# Patient Record
Sex: Female | Born: 1985
Health system: Southern US, Community
[De-identification: ages and names within clinical notes are randomized; demographics above are authoritative.]

## PROBLEM LIST (undated history)

## (undated) DIAGNOSIS — F419 Anxiety disorder, unspecified: Secondary | ICD-10-CM

## (undated) DIAGNOSIS — J45909 Unspecified asthma, uncomplicated: Secondary | ICD-10-CM

## (undated) DIAGNOSIS — F32A Depression, unspecified: Secondary | ICD-10-CM

## (undated) DIAGNOSIS — K219 Gastro-esophageal reflux disease without esophagitis: Secondary | ICD-10-CM

## (undated) DIAGNOSIS — E282 Polycystic ovarian syndrome: Secondary | ICD-10-CM

## (undated) DIAGNOSIS — R5383 Other fatigue: Secondary | ICD-10-CM

## (undated) DIAGNOSIS — K829 Disease of gallbladder, unspecified: Secondary | ICD-10-CM

## (undated) DIAGNOSIS — F5081 Binge eating disorder: Secondary | ICD-10-CM

## (undated) DIAGNOSIS — F988 Other specified behavioral and emotional disorders with onset usually occurring in childhood and adolescence: Secondary | ICD-10-CM

## (undated) DIAGNOSIS — R0602 Shortness of breath: Secondary | ICD-10-CM

## (undated) HISTORY — DX: Depression, unspecified: F32.A

## (undated) HISTORY — DX: Other fatigue: R53.83

## (undated) HISTORY — DX: Shortness of breath: R06.02

## (undated) HISTORY — PX: TONSILLECTOMY: SUR1361

## (undated) HISTORY — DX: Other specified behavioral and emotional disorders with onset usually occurring in childhood and adolescence: F98.8

## (undated) HISTORY — PX: ADENOIDECTOMY: SUR15

## (undated) HISTORY — DX: Anxiety disorder, unspecified: F41.9

## (undated) HISTORY — DX: Polycystic ovarian syndrome: E28.2

## (undated) HISTORY — DX: Disease of gallbladder, unspecified: K82.9

## (undated) HISTORY — DX: Binge eating disorder: F50.81

## (undated) HISTORY — DX: Gastro-esophageal reflux disease without esophagitis: K21.9

---

## 2019-06-07 DIAGNOSIS — F5105 Insomnia due to other mental disorder: Secondary | ICD-10-CM | POA: Insufficient documentation

## 2019-06-07 DIAGNOSIS — F411 Generalized anxiety disorder: Secondary | ICD-10-CM | POA: Insufficient documentation

## 2019-07-21 ENCOUNTER — Other Ambulatory Visit: Payer: Self-pay

## 2019-07-21 ENCOUNTER — Encounter (HOSPITAL_BASED_OUTPATIENT_CLINIC_OR_DEPARTMENT_OTHER): Payer: Self-pay | Admitting: Emergency Medicine

## 2019-07-21 ENCOUNTER — Emergency Department (HOSPITAL_BASED_OUTPATIENT_CLINIC_OR_DEPARTMENT_OTHER): Payer: BLUE CROSS/BLUE SHIELD

## 2019-07-21 ENCOUNTER — Emergency Department (HOSPITAL_BASED_OUTPATIENT_CLINIC_OR_DEPARTMENT_OTHER)
Admission: EM | Admit: 2019-07-21 | Discharge: 2019-07-21 | Disposition: A | Discharge status: Home / Self Care | Payer: BLUE CROSS/BLUE SHIELD | Attending: Emergency Medicine | Admitting: Emergency Medicine

## 2019-07-21 DIAGNOSIS — Z79899 Other long term (current) drug therapy: Secondary | ICD-10-CM | POA: Insufficient documentation

## 2019-07-21 DIAGNOSIS — F17210 Nicotine dependence, cigarettes, uncomplicated: Secondary | ICD-10-CM | POA: Insufficient documentation

## 2019-07-21 DIAGNOSIS — M25572 Pain in left ankle and joints of left foot: Secondary | ICD-10-CM | POA: Insufficient documentation

## 2019-07-21 HISTORY — DX: Unspecified asthma, uncomplicated: J45.909

## 2019-07-21 LAB — PREGNANCY, URINE: Preg Test, Ur: NEGATIVE

## 2019-07-21 MED ORDER — NAPROXEN 500 MG PO TABS: 500.0000 mg | ORAL_TABLET | Freq: Two times a day (BID) | ORAL | 0 refills | Status: AC

## 2019-07-21 MED ORDER — NAPROXEN 250 MG PO TABS
500.0000 mg | ORAL_TABLET | Freq: Once | ORAL | Status: AC
  Administered 2019-07-21: 500 mg via ORAL
  Filled 2019-07-21: qty 2

## 2019-07-21 NOTE — Discharge Instructions (Addendum)
I prescribed a short prescription of anti-inflammatories to help with your pain, please take 1 tablet twice a day for the next 7 days.  A referral to sports medicine provider has been given to you today, please schedule an appointment in order to further evaluate your likely ankle sprain.  You may also apply heat, ice to the area along with keep this elevated.

## 2019-07-21 NOTE — ED Provider Notes (Signed)
[facility] EMERGENCY DEPARTMENT Provider Note   CSN: [id number] Arrival date & time: 07/21/19  [facility]     History   Chief Complaint Chief Complaint  Patient presents with  . Ankle Injury    HPI Ashley Lucas is a 33 y.o. female.     33 y.o female with a PMH of Asthma presents to the ED with a chief complaint of left ankle pain x 2 days. Patient states she was at her bachelorette party this weekend wearing wedges when she twisted her ankle having her foot evert on the side. She ports there was pain at the time of the incident however she was drinking.  States the pain has gotten significantly worse, swelling has increased.  She has been taking Tylenol around-the-clock without improvement in symptoms.  States most of the pain is worse with plantarflexion.  Of note, patient also voices being delayed on her menstrual cycle, she is currently not on any birth control.  She denies any other trauma, nausea, vomiting, other complaints.  The history is provided by the patient.  Ankle Injury    Past Medical History:  Diagnosis Date  . Asthma     There are no active problems to display for this patient.   Past Surgical History:  Procedure Laterality Date  . TONSILLECTOMY       OB History   No obstetric history on file.      Home Medications    Prior to Admission medications   Medication Sig Start Date End Date Taking? Authorizing Provider  clonazePAM (KLONOPIN) 1 MG tablet Take 1 mg by mouth 2 (two) times daily as needed for anxiety.   Yes [provider]  Venlafaxine HCl 150 MG TB24 Take 150 mg by mouth daily.   Yes [provider]  naproxen (NAPROSYN) 500 MG tablet Take 1 tablet (500 mg total) by mouth 2 (two) times daily for 7 days. 07/21/19 07/28/19  Janeece Fitting, PA-C    Family History No family history on file.  Social History Social History   Tobacco Use  . Smoking status: Current Some Day Smoker  . Smokeless tobacco: Never Used   Substance Use Topics  . Alcohol use: Yes  . Drug use: Not on file     Allergies   Patient has no known allergies.   Review of Systems Review of Systems  Constitutional: Negative for fever.  Musculoskeletal: Positive for arthralgias.     Physical Exam Updated Vital Signs BP (!) 142/92 (BP Location: Left Arm)   Pulse 98   Temp 99.1 F (37.3 C) (Oral)   Resp 18   Ht 5\' 6"  (1.676 m)   Wt 113.4 kg   LMP 06/13/2019   SpO2 98%   BMI 40.35 kg/m   Physical Exam Vitals signs and nursing note reviewed.  Constitutional:      Appearance: Normal appearance. She is obese. She is not ill-appearing.  HENT:     Head: Normocephalic and atraumatic.     Mouth/Throat:     Mouth: Mucous membranes are dry.  Cardiovascular:     Rate and Rhythm: Normal rate.  Pulmonary:     Effort: Pulmonary effort is normal.  Abdominal:     General: Abdomen is flat.  Musculoskeletal:     Left ankle: She exhibits swelling. She exhibits normal range of motion, no laceration and normal pulse.       Feet:  Neurological:     Mental Status: She is alert and oriented to person, place,  and time.      ED Treatments / Results  Labs (all labs ordered are listed, but only abnormal results are displayed) Labs Reviewed  PREGNANCY, URINE    EKG None  Radiology Dg Ankle Complete Left  Result Date: 07/21/2019 CLINICAL DATA:  Left ankle injury. EXAM: LEFT ANKLE COMPLETE - 3+ VIEW COMPARISON:  None. FINDINGS: There is diffuse soft tissue swelling. No underlying fracture or subluxation identified. No radiopaque foreign bodies. IMPRESSION: 1. Soft tissue swelling. 2. No acute osseous abnormality noted. Electronically Signed   By: Signa Kell M.D.   On: 07/21/2019 17:37    Procedures Procedures (including critical care time)  Medications Ordered in ED Medications  naproxen (NAPROSYN) tablet 500 mg (has no administration in time range)     Initial Impression / Assessment and Plan / ED Course  I  have reviewed the triage vital signs and the nursing notes.  Pertinent labs & imaging results that were available during my care of the patient were reviewed by me and considered in my medical decision making (see chart for details).        Patient with no pertinent past medical history presents the ED with complaints of left ankle pain x 2 days after spraining her ankle at her bachelor party.  Reports swelling and pain has increased, most of the pain is mostly located on the lateral aspect along the lateral malleolus.  Significant swelling noted to the ankle.  Neurovascularly intact.  Will obtain an x-ray to further evaluate patient's condition.  Of note, patient reports she is 2 weeks late on her menstrual cycle, currently not on any birth control.  She would also like a pregnancy test checked during her ED visit.  X-ray of her left ankle showed: 1. Soft tissue swelling.  2. No acute osseous abnormality noted.   UA was negative.  These results have been discussed with patient at length, she will be placed on an ASO brace along with follow-up with orthopedist.  A referral for Dr. Clare Gandy have been provided, she also go home on a short course of anti-inflammatories to help with his pain.  Rice therapy is encouraged.    Portions of this note were generated with Scientist, clinical (histocompatibility and immunogenetics). Dictation errors may occur despite best attempts at proofreading.  Final Clinical Impressions(s) / ED Diagnoses   Final diagnoses:  Acute left ankle pain    ED Discharge Orders         Ordered    naproxen (NAPROSYN) 500 MG tablet  2 times daily     07/21/19 1744           Claude Manges, PA-C 07/21/19 1746    Pricilla Loveless, MD 07/21/19 2318

## 2019-07-21 NOTE — ED Triage Notes (Signed)
Pt twisted her L ankle Friday while drinking alcohol. Swelling noted.

## 2019-10-02 DIAGNOSIS — F411 Generalized anxiety disorder: Secondary | ICD-10-CM | POA: Diagnosis not present

## 2019-10-02 DIAGNOSIS — F330 Major depressive disorder, recurrent, mild: Secondary | ICD-10-CM | POA: Diagnosis not present

## 2019-11-19 ENCOUNTER — Ambulatory Visit (INDEPENDENT_AMBULATORY_CARE_PROVIDER_SITE_OTHER): Payer: Managed Care, Other (non HMO) | Admitting: Advanced Practice Midwife

## 2019-11-19 ENCOUNTER — Other Ambulatory Visit: Payer: Self-pay

## 2019-11-19 ENCOUNTER — Encounter: Payer: Self-pay | Admitting: Advanced Practice Midwife

## 2019-11-19 VITALS — BP 120/85 | HR 84 | Ht 66.0 in | Wt 263.1 lb

## 2019-11-19 DIAGNOSIS — Z124 Encounter for screening for malignant neoplasm of cervix: Secondary | ICD-10-CM

## 2019-11-19 DIAGNOSIS — Z01419 Encounter for gynecological examination (general) (routine) without abnormal findings: Secondary | ICD-10-CM

## 2019-11-19 DIAGNOSIS — Z1151 Encounter for screening for human papillomavirus (HPV): Secondary | ICD-10-CM

## 2019-11-19 DIAGNOSIS — E282 Polycystic ovarian syndrome: Secondary | ICD-10-CM

## 2019-11-19 DIAGNOSIS — N911 Secondary amenorrhea: Secondary | ICD-10-CM

## 2019-11-19 MED ORDER — MEDROXYPROGESTERONE ACETATE 10 MG PO TABS: 10.0000 mg | ORAL_TABLET | Freq: Every day | ORAL | 2 refills | Status: DC

## 2019-11-19 MED ORDER — METFORMIN HCL ER 500 MG PO TB24: 500.0000 mg | ORAL_TABLET | Freq: Every day | ORAL | 2 refills | Status: DC

## 2019-11-19 MED ORDER — PRENATAL VITAMIN 27-0.8 MG PO TABS: 1.0000 | ORAL_TABLET | Freq: Every day | ORAL | 12 refills | Status: AC

## 2019-11-19 NOTE — Progress Notes (Signed)
Patient ID: Ashley Lucas, female   DOB: [birth year], 34 y.o.   MRN: [id number] GYNECOLOGY ANNUAL PREVENTATIVE CARE ENCOUNTER NOTE  Subjective:   Ashley Lucas is a 34 y.o. G0P0000 female here for a routine annual gynecologic exam.  Current complaints: no period since October (trying to get pregnant).  Was diagnosed with PCOS years ago.  Used to be on Metformin in past.  No recent testing or treatment.   Denies abnormal vaginal bleeding, discharge, pelvic pain, problems with intercourse or other gynecologic concerns.    Gynecologic History Patient's last menstrual period was 08/27/2019 (exact date). Contraception: none Last Pap: 2018. Results were: normal   Obstetric History OB History  Gravida Para Term Preterm AB Living  0 0 0 0 0 0  SAB TAB Ectopic Multiple Live Births  0 0 0 0 0    Past Medical History:  Diagnosis Date  . Asthma   . PCOS (polycystic ovarian syndrome)     Past Surgical History:  Procedure Laterality Date  . TONSILLECTOMY      Current Outpatient Medications on File Prior to Visit  Medication Sig Dispense Refill  . clonazePAM (KLONOPIN) 1 MG tablet Take 1 mg by mouth 2 (two) times daily as needed for anxiety.    . Venlafaxine HCl 150 MG TB24 Take 150 mg by mouth daily.     No current facility-administered medications on file prior to visit.    No Known Allergies  Social History   Socioeconomic History  . Marital status: Single    Spouse name: Not on file  . Number of children: Not on file  . Years of education: Not on file  . Highest education level: Not on file  Occupational History  . Not on file  Tobacco Use  . Smoking status: Current Some Day Smoker  . Smokeless tobacco: Never Used  Substance and Sexual Activity  . Alcohol use: Yes    Alcohol/week: 3.0 standard drinks    Types: 2 Glasses of wine, 1 Cans of beer per week  . Drug use: Never  . Sexual activity: Yes    Birth control/protection: None  Other Topics Concern  .  Not on file  Social History Narrative  . Not on file   Social Determinants of Health   Financial Resource Strain:   . Difficulty of Paying Living Expenses: Not on file  Food Insecurity:   . Worried About Charity fundraiser in the Last Year: Not on file  . Ran Out of Food in the Last Year: Not on file  Transportation Needs:   . Lack of Transportation (Medical): Not on file  . Lack of Transportation (Non-Medical): Not on file  Physical Activity:   . Days of Exercise per Week: Not on file  . Minutes of Exercise per Session: Not on file  Stress:   . Feeling of Stress : Not on file  Social Connections:   . Frequency of Communication with Friends and Family: Not on file  . Frequency of Social Gatherings with Friends and Family: Not on file  . Attends Religious Services: Not on file  . Active Member of Clubs or Organizations: Not on file  . Attends Archivist Meetings: Not on file  . Marital Status: Not on file  Intimate Partner Violence:   . Fear of Current or Ex-Partner: Not on file  . Emotionally Abused: Not on file  . Physically Abused: Not on file  . Sexually Abused: Not on file    History reviewed.  No pertinent family history.  The following portions of the patient's history were reviewed and updated as appropriate: allergies, current medications, past family history, past medical history, past social history, past surgical history and problem list.  Review of Systems Pertinent items noted in HPI and remainder of comprehensive ROS otherwise negative.   Objective:  BP 120/85   Pulse 84   Ht 5\' 6"  (1.676 m)   Wt 263 lb 1.9 oz (119.4 kg)   LMP 08/27/2019 (Exact Date)   BMI 42.47 kg/m  CONSTITUTIONAL: Well-developed, well-nourished female in no acute distress.  EYES: Conjunctivae and EOM are normal. Pupils are equal, round, and reactive to light. No scleral icterus.  NECK: Normal range of motion, supple, no masses.  Normal thyroid.  SKIN: Skin is warm and dry.  No rash noted. Not diaphoretic. No erythema. No pallor. NEUROLOGIC: Alert and oriented to person, place, and time. Normal reflexes, muscle tone coordination. No cranial nerve deficit noted. PSYCHIATRIC: Normal mood and affect. Normal behavior. Normal judgment and thought content. CARDIOVASCULAR: Normal heart rate noted, regular rhythm RESPIRATORY: Effort and breath sounds normal, no problems with respiration noted. BREASTS: Symmetric in size. No masses, skin changes, nipple drainage, or lymphadenopathy. ABDOMEN: Soft, normal bowel sounds, no distention noted.  No tenderness, rebound or guarding.  PELVIC: Normal appearing external genitalia; normal appearing vaginal mucosa and cervix.  No abnormal discharge noted.  Pap smear obtained.  Normal uterine size, no other palpable masses, no uterine or adnexal tenderness. MUSCULOSKELETAL: Normal range of motion. No tenderness.  No cyanosis, clubbing, or edema.  2+ distal pulses.   Assessment:  Annual gynecologic examination with pap smear Polycystic Ovarian Syndrome   Plan:  Will follow up results of pap smear and manage accordingly. Consulted Dr 13/12/2018 re: evaluation and treatment of probable PCOS He agrees with testing for DHEA, test, TSH, HgbA1C Will order a Provera challenge to shed endometrium Will start Rx for Metformin XL 500mg  qd, discussed possible loose stools while adjusting Rx Prenatal vitamins for preconception RTO 4 weeks to review lab results and plan of care with MD Routine preventative health maintenance measures emphasized. Please refer to After Visit Summary for other counseling recommendations.

## 2019-11-19 NOTE — Progress Notes (Signed)
Pt has PCOS  LMP was 08/27/2019.

## 2019-11-19 NOTE — Patient Instructions (Signed)
Preparing for Pregnancy If you are considering becoming pregnant, make an appointment to see your regular health care provider to learn how to prepare for a safe and healthy pregnancy (preconception care). During a preconception care visit, your health care provider will:  Do a complete physical exam, including a Pap test.  Take a complete medical history.  Give you information, answer your questions, and help you resolve problems. Preconception checklist Medical history  Tell your health care provider about any current or past medical conditions. Your pregnancy or your ability to become pregnant may be affected by chronic conditions, such as diabetes, chronic hypertension, and thyroid problems.  Include your family's medical history as well as your partner's medical history.  Tell your health care provider about any history of STIs (sexually transmitted infections).These can affect your pregnancy. In some cases, they can be passed to your baby. Discuss any concerns that you have about STIs.  If indicated, discuss the benefits of genetic testing. This testing will show whether there are any genetic conditions that may be passed from you or your partner to your baby.  Tell your health care provider about: ? Any problems you have had with conception or pregnancy. ? Any medicines you take. These include vitamins, herbal supplements, and over-the-counter medicines. ? Your history of immunizations. Discuss any vaccinations that you may need. Diet  Ask your health care provider what to include in a healthy diet that has a balance of nutrients. This is especially important when you are pregnant or preparing to become pregnant.  Ask your health care provider to help you reach a healthy weight before pregnancy. ? If you are overweight, you may be at higher risk for certain complications, such as high blood pressure, diabetes, and preterm birth. ? If you are underweight, you are more likely to  have a baby who has a low birth weight. Lifestyle, work, and home  Let your health care provider know: ? About any lifestyle habits that you have, such as alcohol use, drug use, or smoking. ? About recreational activities that may put you at risk during pregnancy, such as downhill skiing and certain exercise programs. ? Tell your health care provider about any international travel, especially any travel to places with an active Zika virus outbreak. ? About harmful substances that you may be exposed to at work or at home. These include chemicals, pesticides, radiation, or even litter boxes. ? If you do not feel safe at home. Mental health  Tell your health care provider about: ? Any history of mental health conditions, including feelings of depression, sadness, or anxiety. ? Any medicines that you take for a mental health condition. These include herbs and supplements. Home instructions to prepare for pregnancy Lifestyle   Eat a balanced diet. This includes fresh fruits and vegetables, whole grains, lean meats, low-fat dairy products, healthy fats, and foods that are high in fiber. Ask to meet with a nutritionist or registered dietitian for assistance with meal planning and goals.  Get regular exercise. Try to be active for at least 30 minutes a day on most days of the week. Ask your health care provider which activities are safe during pregnancy.  Do not use any products that contain nicotine or tobacco, such as cigarettes and e-cigarettes. If you need help quitting, ask your health care provider.  Do not drink alcohol.  Do not take illegal drugs.  Maintain a healthy weight. Ask your health care provider what weight range is right for you. General   instructions  Keep an accurate record of your menstrual periods. This makes it easier for your health care provider to determine your baby's due date.  Begin taking prenatal vitamins and folic acid supplements daily as directed by your  health care provider.  Manage any chronic conditions, such as high blood pressure and diabetes, as told by your health care provider. This is important. How do I know that I am pregnant? You may be pregnant if you have been sexually active and you miss your period. Symptoms of early pregnancy include:  Mild cramping.  Very light vaginal bleeding (spotting).  Feeling unusually tired.  Nausea and vomiting (morning sickness). If you have any of these symptoms and you suspect that you might be pregnant, you can take a home pregnancy test. These tests check for a hormone in your urine (human chorionic gonadotropin, or hCG). A woman's body begins to make this hormone during early pregnancy. These tests are very accurate. Wait until at least the first day after you miss your period to take one. If the test shows that you are pregnant (you get a positive result), call your health care provider to make an appointment for prenatal care. What should I do if I become pregnant?      Make an appointment with your health care provider as soon as you suspect you are pregnant.  Do not use any products that contain nicotine, such as cigarettes, chewing tobacco, and e-cigarettes. If you need help quitting, ask your health care provider.  Do not drink alcoholic beverages. Alcohol is related to a number of birth defects.  Avoid toxic odors and chemicals.  You may continue to have sexual intercourse if it does not cause pain or other problems, such as vaginal bleeding. This information is not intended to replace advice given to you by your health care provider. Make sure you discuss any questions you have with your health care provider. Document Revised: 10/12/2017 Document Reviewed: 05/01/2016 Elsevier Patient Education  2020 [employer] Polycystic Ovarian Syndrome  Polycystic ovarian syndrome (PCOS) is a common hormonal disorder among women of reproductive age. In most women with PCOS, many small  fluid-filled sacs (cysts) grow on the ovaries, and the cysts are not part of a normal menstrual cycle. PCOS can cause problems with your menstrual periods and make it difficult to get pregnant. It can also cause an increased risk of miscarriage with pregnancy. If it is not treated, PCOS can lead to serious health problems, such as diabetes and heart disease. What are the causes? The cause of PCOS is not known, but it may be the result of a combination of certain factors, such as:  Irregular menstrual cycle.  High levels of certain hormones (androgens).  Problems with the hormone that helps to control blood sugar (insulin resistance).  Certain genes. What increases the risk? This condition is more likely to develop in women who have a family history of PCOS. What are the signs or symptoms? Symptoms of PCOS may include:  Multiple ovarian cysts.  Infrequent periods or no periods.  Periods that are too frequent or too heavy.  Unpredictable periods.  Inability to get pregnant (infertility) because of not ovulating.  Increased growth of hair on the face, chest, stomach, back, thumbs, thighs, or toes.  Acne or oily skin. Acne may develop during adulthood, and it may not respond to treatment.  Pelvic pain.  Weight gain or obesity.  Patches of thickened and dark brown or black skin on the neck, arms,  breasts, or thighs (acanthosis nigricans).  Excess hair growth on the face, chest, abdomen, or upper thighs (hirsutism). How is this diagnosed? This condition is diagnosed based on:  Your medical history.  A physical exam, including a pelvic exam. Your health care provider may look for areas of increased hair growth on your skin.  Tests, such as: ? Ultrasound. This may be used to examine the ovaries and the lining of the uterus (endometrium) for cysts. ? Blood tests. These may be used to check levels of sugar (glucose), female hormone (testosterone), and female hormones (estrogen and  progesterone) in your blood. How is this treated? There is no cure for PCOS, but treatment can help to manage symptoms and prevent more health problems from developing. Treatment varies depending on:  Your symptoms.  Whether you want to have a baby or whether you need birth control (contraception). Treatment may include nutrition and lifestyle changes along with:  Progesterone hormone to start a menstrual period.  Birth control pills to help you have regular menstrual periods.  Medicines to make you ovulate, if you want to get pregnant.  Medicine to reduce excessive hair growth.  Surgery, in severe cases. This may involve making small holes in one or both of your ovaries. This decreases the amount of testosterone that your body produces. Follow these instructions at home:  Take over-the-counter and prescription medicines only as told by your health care provider.  Follow a healthy meal plan. This can help you reduce the effects of PCOS. ? Eat a healthy diet that includes lean proteins, complex carbohydrates, fresh fruits and vegetables, low-fat dairy products, and healthy fats. Make sure to eat enough fiber.  If you are overweight, lose weight as told by your health care provider. ? Losing 10% of your body weight may improve symptoms. ? Your health care provider can determine how much weight loss is best for you and can help you lose weight safely.  Keep all follow-up visits as told by your health care provider. This is important. Contact a health care provider if:  Your symptoms do not get better with medicine.  You develop new symptoms. This information is not intended to replace advice given to you by your health care provider. Make sure you discuss any questions you have with your health care provider. Document Revised: 09/22/2017 Document Reviewed: 03/27/2016 Elsevier Patient Education  2020 [employer] Inc. Diet for Polycystic Ovary Syndrome Polycystic ovary syndrome (PCOS)  is a disorder of the chemicals (hormones) that regulate a woman's reproductive system, including monthly periods (menstruation). The condition causes important hormones to be out of balance. PCOS can:  Stop your periods or make them irregular.  Cause cysts to develop on your ovaries.  Make it difficult to get pregnant.  Stop your body from responding to the effects of insulin (insulin resistance). Insulin resistance can lead to obesity and diabetes. Changing what you eat can help you manage PCOS and improve your health. Following a balanced diet can help you lose weight and improve the way that your body uses insulin. What are tips for following this plan?  Follow a balanced diet for meals and snacks. Eat breakfast, lunch, dinner, and one or two snacks every day.  Include protein in each meal and snack.  Choose whole grains instead of products that are made with refined flour.  Eat a variety of foods.  Exercise regularly as told by your health care provider. Aim to do 30 or more minutes of exercise on most days of  the week.  If you are overweight or obese: ? Pay attention to how many calories you eat. Cutting down on calories can help you lose weight. ? Work with your health care provider or a diet and nutrition specialist (dietitian) to figure out how many calories you need each day. What foods can I eat?  Fruits Include a variety of colors and types. All fruits are helpful for PCOS. Vegetables Include a variety of colors and types. All vegetables are helpful for PCOS. Grains Whole grains, such as whole wheat. Whole-grain breads, crackers, cereals, and pasta. Unsweetened oatmeal, bulgur, barley, quinoa, and brown rice. Tortillas made from corn or whole-wheat flour. Meats and other proteins Low-fat (lean) proteins, such as fish, chicken, beans, eggs, and tofu. Dairy Low-fat dairy products, such as skim milk, cheese sticks, and yogurt. Beverages Low-fat or fat-free drinks, such  as water, low-fat milk, sugar-free drinks, and small amounts of 100% fruit juice. Seasonings and condiments Ketchup. Mustard. Barbecue sauce. Relish. Low-fat or fat-free mayonnaise. Fats and oils Olive oil or canola oil. Walnuts and almonds. The items listed above may not be a complete list of recommended foods and beverages. Contact a dietitian for more options. What foods are not recommended? Foods that are high in calories or fat. Fried foods. Sweets. Products that are made from refined white flour, including white bread, pastries, white rice, and pasta. The items listed above may not be a complete list of foods and beverages to avoid. Contact a dietitian for more information. Summary  PCOS is a hormonal imbalance that affects a woman's reproductive system.  You can help to manage your PCOS by exercising regularly and eating a healthy, varied diet of vegetables, fruit, whole grains, low-fat (lean) protein, and low-fat dairy products.  Changing what you eat can improve the way that your body uses insulin, help your hormones reach normal levels, and help you lose weight. This information is not intended to replace advice given to you by your health care provider. Make sure you discuss any questions you have with your health care provider. Document Revised: 01/30/2019 Document Reviewed: 08/14/2017 Elsevier Patient Education  [place].

## 2019-11-20 LAB — HCG, SERUM, QUALITATIVE: hCG,Beta Subunit,Qual,Serum: NEGATIVE m[IU]/mL (ref <6)

## 2019-11-20 LAB — TESTOSTERONE: Testosterone: 37 ng/dL (ref 8–48)

## 2019-11-20 LAB — DHEA-SULFATE: DHEA-SO4: 58.7 ug/dL — ABNORMAL LOW (ref 84.8–378.0)

## 2019-11-20 LAB — HEMOGLOBIN A1C
Est. average glucose Bld gHb Est-mCnc: 91 mg/dL
Hgb A1c MFr Bld: 4.8 % (ref 4.8–5.6)

## 2019-11-20 LAB — TSH: TSH: 1.01 u[IU]/mL (ref 0.450–4.500)

## 2019-11-25 LAB — CYTOLOGY - PAP
Comment: NEGATIVE
High risk HPV: POSITIVE — AB

## 2019-11-29 ENCOUNTER — Encounter: Payer: Self-pay | Admitting: Family Medicine

## 2019-11-29 ENCOUNTER — Other Ambulatory Visit: Payer: Self-pay

## 2019-11-29 ENCOUNTER — Ambulatory Visit (INDEPENDENT_AMBULATORY_CARE_PROVIDER_SITE_OTHER): Payer: Managed Care, Other (non HMO) | Admitting: Family Medicine

## 2019-11-29 VITALS — BP 129/88 | HR 105 | Ht 66.0 in | Wt 262.0 lb

## 2019-11-29 DIAGNOSIS — E282 Polycystic ovarian syndrome: Secondary | ICD-10-CM | POA: Diagnosis not present

## 2019-11-29 DIAGNOSIS — R87612 Low grade squamous intraepithelial lesion on cytologic smear of cervix (LGSIL): Secondary | ICD-10-CM | POA: Diagnosis not present

## 2019-11-29 MED ORDER — METFORMIN HCL ER 500 MG PO TB24: 1500.0000 mg | ORAL_TABLET | Freq: Every day | ORAL | 6 refills | Status: DC

## 2019-11-29 NOTE — Progress Notes (Signed)
Patient presents for follow up of PCOS discussion and to review pap smear results. Armandina Stammer RN

## 2019-11-29 NOTE — Progress Notes (Signed)
   Subjective:    Patient ID: Ashley Lucas, female    DOB: 1986-09-06, 34 y.o.   MRN: [id number]  HPI Patient seen for PCOS. Was diagnosed as teen with elevated serum testosterone and irregular periods, polycystic ovaries. Has used COC in the past for cycle control. Was also on metformin. Would like to get pregnant. Was seen last week for annual. Last period was in November. Was placed on provera x 10 days and metformin xr '500mg'$ . Last dose of provera yesterday. No bleeding yet.   Review of Systems     Objective:   Physical Exam Constitutional:      Appearance: Normal appearance.  Cardiovascular:     Rate and Rhythm: Normal rate.     Pulses: Normal pulses.  Pulmonary:     Effort: Pulmonary effort is normal.  Skin:    Capillary Refill: Capillary refill takes less than 2 seconds.  Neurological:     General: No focal deficit present.     Mental Status: She is alert.  Psychiatric:        Mood and Affect: Mood normal.        Behavior: Behavior normal.        Thought Content: Thought content normal.        Judgment: Judgment normal.       Assessment & Plan:  1. PCOS (polycystic ovarian syndrome) May have bleeding over next several days. Increase metformin to '1500mg'$  daily for maximum fertility effects. Discussed possible side effects to the medication: nausea, upset stomach, diarrhea. Recommended taking with largest meal. Also recommended ovulation kit. F/u in 3 months for PCOS/fertility.  2. LGSIL on Pap smear of cervix Discussed PAP and LGSIL. Recommend colposcopy. Will schedule next available.

## 2019-12-19 ENCOUNTER — Other Ambulatory Visit: Payer: Self-pay

## 2019-12-19 ENCOUNTER — Encounter (INDEPENDENT_AMBULATORY_CARE_PROVIDER_SITE_OTHER): Payer: Managed Care, Other (non HMO) | Admitting: Family Medicine

## 2019-12-20 NOTE — Progress Notes (Signed)
Patient moved appointment

## 2019-12-23 ENCOUNTER — Ambulatory Visit (INDEPENDENT_AMBULATORY_CARE_PROVIDER_SITE_OTHER): Payer: Managed Care, Other (non HMO) | Admitting: Family Medicine

## 2019-12-23 ENCOUNTER — Other Ambulatory Visit: Payer: Self-pay

## 2019-12-23 ENCOUNTER — Encounter: Payer: Self-pay | Admitting: Family Medicine

## 2019-12-23 ENCOUNTER — Other Ambulatory Visit (HOSPITAL_COMMUNITY)
Admission: RE | Admit: 2019-12-23 | Discharge: 2019-12-23 | Disposition: A | Discharge status: Home / Self Care | Payer: Managed Care, Other (non HMO) | Source: Ambulatory Visit | Attending: Family Medicine | Admitting: Family Medicine

## 2019-12-23 VITALS — BP 111/85 | HR 105 | Wt 263.1 lb

## 2019-12-23 DIAGNOSIS — R87612 Low grade squamous intraepithelial lesion on cytologic smear of cervix (LGSIL): Secondary | ICD-10-CM | POA: Diagnosis not present

## 2019-12-23 DIAGNOSIS — Z3202 Encounter for pregnancy test, result negative: Secondary | ICD-10-CM

## 2019-12-23 LAB — POCT URINE PREGNANCY: Preg Test, Ur: NEGATIVE

## 2019-12-23 NOTE — Patient Instructions (Signed)
Colposcopy, Care After This sheet gives you information about how to care for yourself after your procedure. Your doctor may also give you more specific instructions. If you have problems or questions, contact your doctor. What can I expect after the procedure? If you did not have a tissue sample removed (did not have a biopsy), you may only have some spotting for a few days. You can go back to your normal activities. If you had a tissue sample removed, it is common to have:  Soreness and pain. This may last for a few days.  Light-headedness.  Mild bleeding from your vagina or dark-colored, grainy discharge from your vagina. This may last for a few days. You may need to wear a sanitary pad.  Spotting for at least 48 hours after the procedure. Follow these instructions at home:   Take over-the-counter and prescription medicines only as told by your doctor. Ask your doctor what medicines you can start taking again. This is very important if you take blood-thinning medicine.  Do not drive or use heavy machinery while taking prescription pain medicine.  For 3 days, or as long as your doctor tells you, avoid: ? Douching. ? Using tampons. ? Having sex.  If you use birth control (contraception), keep using it.  Limit activity for the first day after the procedure. Ask your doctor what activities are safe for you.  It is up to you to get the results of your procedure. Ask your doctor when your results will be ready.  Keep all follow-up visits as told by your doctor. This is important. Contact a doctor if:  You get a skin rash. Get help right away if:  You are bleeding a lot from your vagina. It is a lot of bleeding if you are using more than one pad an hour for 2 hours in a row.  You have clumps of blood (blood clots) coming from your vagina.  You have a fever.  You have chills  You have pain in your lower belly (pelvic area).  You have signs of infection, such as vaginal  discharge that is: ? Different than usual. ? Yellow. ? Bad-smelling.  You have very pain or cramps in your lower belly that do not get better with medicine.  You feel light-headed.  You feel dizzy.  You pass out (faint). Summary  If you did not have a tissue sample removed (did not have a biopsy), you may only have some spotting for a few days. You can go back to your normal activities.  If you had a tissue sample removed, it is common to have mild pain and spotting for 48 hours.  For 3 days, or as long as your doctor tells you, avoid douching, using tampons and having sex.  Get help right away if you have bleeding, very bad pain, or signs of infection. This information is not intended to replace advice given to you by your health care provider. Make sure you discuss any questions you have with your health care provider. Document Revised: 09/22/2017 Document Reviewed: 06/29/2016 Elsevier Patient Education  2020 Elsevier Inc.  

## 2019-12-23 NOTE — Progress Notes (Signed)
Patient Name: Ashley Lucas, female   DOB: 1986/08/15, 34 y.o.  MRN: [id number]  Colposcopy Procedure Note:  [id number] Pregnancy status: Unknown Indications: LSIL HPV:  High Risk Cervical History:  Previous Abnormal Pap: normal  Previous Colposcopy: none  Previous LEEP or Cryo: none  Smoking: Current Hysterectomy: No   Patient given informed consent, signed copy in the chart, time out was performed.    Exam: Vulva and Vagina grossly normal.  Cervix viewed with speculum and colposcope after application of acetic acid:  Cervix Fully Visualized Squamocolumnar Junction Visibility: Fully visualized  Acetowhite lesions: none  Other Lesions: None Punctation: Not present  Mosaicism: Not present Abnormal vasculature: No   Biopsies: none ECC: brush  Hemostasis achieved with:  n/a  Colposcopy Impression:  CIN1   Patient was given post procedure instructions.  Will call patient with results.

## 2019-12-24 LAB — SURGICAL PATHOLOGY

## 2020-05-07 ENCOUNTER — Other Ambulatory Visit: Payer: Self-pay

## 2020-05-07 ENCOUNTER — Encounter: Payer: Self-pay | Admitting: Family Medicine

## 2020-05-07 ENCOUNTER — Ambulatory Visit (INDEPENDENT_AMBULATORY_CARE_PROVIDER_SITE_OTHER): Payer: Self-pay | Admitting: Family Medicine

## 2020-05-07 VITALS — BP 115/85 | HR 96 | Ht 66.0 in

## 2020-05-07 DIAGNOSIS — E282 Polycystic ovarian syndrome: Secondary | ICD-10-CM

## 2020-05-07 DIAGNOSIS — N979 Female infertility, unspecified: Secondary | ICD-10-CM

## 2020-05-07 MED ORDER — LETROZOLE 2.5 MG PO TABS: 5.0000 mg | ORAL_TABLET | Freq: Every day | ORAL | 2 refills | Status: AC

## 2020-05-07 MED ORDER — METFORMIN HCL ER 500 MG PO TB24: 1000.0000 mg | ORAL_TABLET | Freq: Every day | ORAL | 6 refills | Status: DC

## 2020-05-07 MED ORDER — MEDROXYPROGESTERONE ACETATE 10 MG PO TABS: 10.0000 mg | ORAL_TABLET | Freq: Every day | ORAL | 2 refills | Status: AC

## 2020-05-07 NOTE — Progress Notes (Signed)
   Subjective:    Patient ID: Ashley Lucas, female    DOB: 09-Sep-1986, 34 y.o.   MRN: [id number]  HPI Patient seen for follow up of PCOS. She has been taking metformin XR '1000mg'$  daily as she was unable to tolerate the '1500mg'$  dose. Her periods have increased to every 60 days or so. Has been trying to get pregnant. Is not using an ovulation kit, but charts her cycle.   Review of Systems     Objective:   Physical Exam Vitals reviewed.  Constitutional:      Appearance: Normal appearance.  Skin:    Capillary Refill: Capillary refill takes less than 2 seconds.  Neurological:     Mental Status: She is alert.  Psychiatric:        Mood and Affect: Mood normal.        Behavior: Behavior normal.        Thought Content: Thought content normal.        Judgment: Judgment normal.       Assessment & Plan:  1. PCOS (polycystic ovarian syndrome) Discussed weight loss to help with fertility. Will refer to [facility]. - Ambulatory referral to [facility]  2. Infertility, female Discussed Letrozole which has been shown to be superior to Clomid in ovulation induction, in PCOS patients with BMI >30.  Patient agreed to try this.  Letrozole prescribed, 2.5 mg po daily on days 3 to 7 following a spontaneous menses or progestin-induced bleed. If the cycle is ovulatory but pregnancy has not occurred, the same dose should be used in the next cycle. If ovulation does not occur, the dose should be increased to 5 mg/day cycle days 3 to 7, with a maximal dose of 7.5 mg/day.   Provera 10 mg po qd x 10 days also prescribed for progestin-induced bleed. Continue ovulation predictor kits and regular intercourse

## 2020-05-07 NOTE — Patient Instructions (Addendum)
  Letrozole prescribed, 2.5 mg po daily on days 3 to 7 following a spontaneous menses or progestin-induced bleed. If the cycle is ovulatory but pregnancy has not occurred, the same dose should be used in the next cycle. If ovulation does not occur, the dose should be increased to 5 mg/day cycle days 3 to 7, with a maximal dose of 7.5 mg/day.   Provera 10 mg po qd x 10 days also prescribed for progestin-induced bleed. Continue ovulation predictor kits and regular intercourse

## 2020-05-07 NOTE — Progress Notes (Signed)
Patient has been off birth control for one year and having trouble getting pregnant. Patient has had four periods since January 2021(LMPs: 05/01/20, 03/15/2020) Patient reports average length between periods is 61 days. Armandina Stammer RN

## 2020-07-30 DIAGNOSIS — R632 Polyphagia: Secondary | ICD-10-CM | POA: Diagnosis not present

## 2020-07-30 DIAGNOSIS — R41840 Attention and concentration deficit: Secondary | ICD-10-CM | POA: Diagnosis not present

## 2020-07-30 DIAGNOSIS — F411 Generalized anxiety disorder: Secondary | ICD-10-CM | POA: Diagnosis not present

## 2020-07-30 DIAGNOSIS — F3342 Major depressive disorder, recurrent, in full remission: Secondary | ICD-10-CM | POA: Diagnosis not present

## 2020-08-19 ENCOUNTER — Ambulatory Visit (INDEPENDENT_AMBULATORY_CARE_PROVIDER_SITE_OTHER): Payer: BC Managed Care – PPO | Admitting: Family Medicine

## 2020-08-19 ENCOUNTER — Other Ambulatory Visit: Payer: Self-pay

## 2020-08-19 ENCOUNTER — Encounter (INDEPENDENT_AMBULATORY_CARE_PROVIDER_SITE_OTHER): Payer: Self-pay | Admitting: Family Medicine

## 2020-08-19 VITALS — BP 115/80 | HR 93 | Temp 97.5°F | Ht 66.0 in | Wt 262.0 lb

## 2020-08-19 DIAGNOSIS — E282 Polycystic ovarian syndrome: Secondary | ICD-10-CM

## 2020-08-19 DIAGNOSIS — Z6841 Body Mass Index (BMI) 40.0 and over, adult: Secondary | ICD-10-CM

## 2020-08-19 DIAGNOSIS — Z9189 Other specified personal risk factors, not elsewhere classified: Secondary | ICD-10-CM

## 2020-08-19 DIAGNOSIS — R0602 Shortness of breath: Secondary | ICD-10-CM

## 2020-08-19 DIAGNOSIS — R0681 Apnea, not elsewhere classified: Secondary | ICD-10-CM

## 2020-08-19 DIAGNOSIS — Z1331 Encounter for screening for depression: Secondary | ICD-10-CM | POA: Diagnosis not present

## 2020-08-19 DIAGNOSIS — Z0289 Encounter for other administrative examinations: Secondary | ICD-10-CM

## 2020-08-19 DIAGNOSIS — F5081 Binge eating disorder: Secondary | ICD-10-CM

## 2020-08-19 DIAGNOSIS — R5383 Other fatigue: Secondary | ICD-10-CM

## 2020-08-19 DIAGNOSIS — F419 Anxiety disorder, unspecified: Secondary | ICD-10-CM

## 2020-08-20 LAB — COMPREHENSIVE METABOLIC PANEL
ALT: 14 IU/L (ref 0–32)
AST: 16 IU/L (ref 0–40)
Albumin/Globulin Ratio: 2 (ref 1.2–2.2)
Albumin: 4.7 g/dL (ref 3.8–4.8)
Alkaline Phosphatase: 72 IU/L (ref 44–121)
BUN/Creatinine Ratio: 17 (ref 9–23)
BUN: 12 mg/dL (ref 6–20)
Bilirubin Total: 0.5 mg/dL (ref 0.0–1.2)
CO2: 28 mmol/L (ref 20–29)
Calcium: 9.8 mg/dL (ref 8.7–10.2)
Chloride: 102 mmol/L (ref 96–106)
Creatinine, Ser: 0.7 mg/dL (ref 0.57–1.00)
GFR calc Af Amer: 133 mL/min/{1.73_m2} (ref >59)
GFR calc non Af Amer: 115 mL/min/{1.73_m2} (ref >59)
Globulin, Total: 2.3 g/dL (ref 1.5–4.5)
Glucose: 85 mg/dL (ref 65–99)
Potassium: 4.6 mmol/L (ref 3.5–5.2)
Sodium: 141 mmol/L (ref 134–144)
Total Protein: 7 g/dL (ref 6.0–8.5)

## 2020-08-20 LAB — LIPID PANEL WITH LDL/HDL RATIO
Cholesterol, Total: 220 mg/dL — ABNORMAL HIGH (ref 100–199)
HDL: 60 mg/dL (ref >39)
LDL Chol Calc (NIH): 142 mg/dL — ABNORMAL HIGH (ref 0–99)
LDL/HDL Ratio: 2.4 ratio (ref 0.0–3.2)
Triglycerides: 99 mg/dL (ref 0–149)
VLDL Cholesterol Cal: 18 mg/dL (ref 5–40)

## 2020-08-20 LAB — CBC WITH DIFFERENTIAL/PLATELET
Basophils Absolute: 0.1 10*3/uL (ref 0.0–0.2)
Basos: 1 %
EOS (ABSOLUTE): 0.2 10*3/uL (ref 0.0–0.4)
Eos: 2 %
Hemoglobin: 14.8 g/dL (ref 11.1–15.9)
Immature Grans (Abs): 0 10*3/uL (ref 0.0–0.1)
Immature Granulocytes: 1 %
Lymphocytes Absolute: 3 10*3/uL (ref 0.7–3.1)
Lymphs: 35 %
MCH: 31.6 pg (ref 26.6–33.0)
MCHC: 34.1 g/dL (ref 31.5–35.7)
MCV: 93 fL (ref 79–97)
Monocytes Absolute: 0.4 10*3/uL (ref 0.1–0.9)
Monocytes: 5 %
Neutrophils Absolute: 4.8 10*3/uL (ref 1.4–7.0)
Neutrophils: 56 %
Platelets: 292 10*3/uL (ref 150–450)
RBC: 4.68 x10E6/uL (ref 3.77–5.28)
RDW: 12.2 % (ref 11.7–15.4)
WBC: 8.5 10*3/uL (ref 3.4–10.8)

## 2020-08-20 LAB — ANEMIA PANEL
Ferritin: 166 ng/mL — ABNORMAL HIGH (ref 15–150)
Folate, Hemolysate: 447 ng/mL
Folate, RBC: 1030 ng/mL (ref >498)
Hematocrit: 43.4 % (ref 34.0–46.6)
Iron Saturation: 20 % (ref 15–55)
Iron: 61 ug/dL (ref 27–159)
Retic Ct Pct: 2.4 % (ref 0.6–2.6)
Total Iron Binding Capacity: 305 ug/dL (ref 250–450)
UIBC: 244 ug/dL (ref 131–425)
Vitamin B-12: 683 pg/mL (ref 232–1245)

## 2020-08-20 LAB — VITAMIN D 25 HYDROXY (VIT D DEFICIENCY, FRACTURES): Vit D, 25-Hydroxy: 28.3 ng/mL — ABNORMAL LOW (ref 30.0–100.0)

## 2020-08-20 LAB — HEMOGLOBIN A1C
Est. average glucose Bld gHb Est-mCnc: 94 mg/dL
Hgb A1c MFr Bld: 4.9 % (ref 4.8–5.6)

## 2020-08-20 LAB — TSH: TSH: 1.81 u[IU]/mL (ref 0.450–4.500)

## 2020-08-20 LAB — T4: T4, Total: 8.5 ug/dL (ref 4.5–12.0)

## 2020-08-20 LAB — INSULIN, RANDOM: INSULIN: 9.2 u[IU]/mL (ref 2.6–24.9)

## 2020-08-20 NOTE — Progress Notes (Signed)
Dear Dr. Adrian Blackwater,   Thank you for referring Ashley Lucas to our clinic. The following note includes my evaluation and treatment recommendations.  Chief Complaint:   OBESITY Ashley Lucas (MR# [id number]) is a 34 y.o. female who presents for evaluation and treatment of obesity and related comorbidities. Current BMI is Body mass index is 42.29 kg/m. Ashley Lucas has been struggling with her weight for many years and has been unsuccessful in either losing weight, maintaining weight loss, or reaching her healthy weight goal.  Ashley Lucas is currently in the action stage of change and ready to dedicate time achieving and maintaining a healthier weight. Ashley Lucas is interested in becoming our patient and working on intensive lifestyle modifications including (but not limited to) diet and exercise for weight loss.  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  She lives with her husband, and has 3 stepsons.  She gets about 2,469 steps per day.  She has a history of bulimia - no purging in 6-7 years - had CBT.  ETOH, wine/beer, 2-3 drinks, 3-4 nights per week.  Her goal weight is 185 pounds. She is actively working on becoming pregnant.   [name]'s habits were reviewed today and are as follows: Her family eats meals together, she thinks her family will eat healthier with her, her desired weight loss is 78 pounds, she has been heavy most of her life, she started gaining weight as a child, her heaviest weight ever was 263 pounds, she craves salty snacks, cookies, and ice cream, she snacks frequently in the evenings, she skips breakfast frequently, she is frequently drinking liquids with calories, she frequently makes poor food choices, she frequently eats larger portions than normal, she has binge eating behaviors and she struggles with emotional eating.  Depression Screen Ashley Lucas's Food and Mood (modified PHQ-9) score was 20.  Depression screen PHQ 2/9  08/19/2020  Decreased Interest 3  Down, Depressed, Hopeless 3  PHQ - 2 Score 6  Altered sleeping 3  Tired, decreased energy 3  Change in appetite 3  Feeling bad or failure about yourself  2  Trouble concentrating 2  Moving slowly or fidgety/restless 0  Suicidal thoughts 0  PHQ-9 Score 19   Assessment/Plan:   1. Other fatigue [name] denies daytime somnolence and reports waking up still tired. Patent has a history of symptoms of morning fatigue and snoring. Ashley Lucas generally gets 6 or 7 hours of sleep per night, and states that she has poor quality sleep. Snoring is present. Apneic episodes are present. Epworth Sleepiness Score is 1.  [name] does feel that her weight is causing her energy to be lower than it should be. Fatigue may be related to obesity, depression or many other causes. Labs will be ordered, and in the meanwhile, Ashley Lucas will focus on self care including making healthy food choices, increasing physical activity and focusing on stress reduction.  - Vitamin B12 - T4 - TSH - VITAMIN D 25 Hydroxy (Vit-D Deficiency, Fractures) - Anemia panel  2. Shortness of breath on exertion Ashley Lucas notes increasing shortness of breath with exercising and seems to be worsening over time with weight gain. She notes getting out of breath sooner with activity than she used to. This has gotten worse recently. [name] denies shortness of breath at rest or orthopnea.  Ashley Lucas does feel that she gets out of breath more easily that she used to when she exercises. Ashley Lucas's shortness of breath appears to be obesity related and exercise induced. She has  agreed to work on weight loss and gradually increase exercise to treat her exercise induced shortness of breath. Will continue to monitor closely.  - EKG 12-Lead - CBC with Differential/Platelet - Comprehensive metabolic panel - Hemoglobin A1c - Insulin, random - Lipid Panel With LDL/HDL Ratio  3. PCOS (polycystic ovarian  syndrome) Controlled. She will continue to focus on protein-rich, low simple carbohydrate foods. We reviewed the importance of hydration, regular exercise for stress reduction, and restorative sleep. Medication: Metformin 1,000 mg daily.  Counseling . PCOS is a leading cause of menstrual irregularities and infertility. It is also associated with obesity, hirsutism (excessive hair growth on the face, chest, or back), and cardiovascular risk factors such as high cholesterol and insulin resistance. . Insulin resistance appears to play a central role.  . Women with PCOS have been shown to have impaired appetite-regulating hormones. . Metformin is one medication that can improve metabolic parameters.  . Women with polycystic ovary syndrome (PCOS) have an increased risk for cardiovascular disease (CVD) - European Journal of Preventive Cardiology.  4. Apnea with snoring and poor sleep Ashley Lucas has symptoms consistent with OSA. See scanned New Patient paperwork.  Plan:  Consider sleep study.  5. Binge eating disorder, with emotional eating Ashley Lucas meets binge eating disorder (BED) requirements.  PHQ-9 is 20.  She started taking Vyvanse 3 months ago.  She says that her triggers are stress and being tired in the early afternoon/late evening (6-9 pm/12-3 on weekends).  Foods:  Sweet/salty carbs.  Fast food.  She eats quickly and is still binging 2-3 days per week.  She has tried Effexor, Zoloft, Buspar, propanolol, and benzodiazepines in the past.   People who binge eat feel as if they don't have control over how much they eat and have feelings of guilt or self-loathing after a binge eating episode. Ashley Lucas estimates that about 30 percent of adults with binge eating disorder also have a history of ADHD. Binge eating and related obesity can underlie health problems like heart disease and diabetes. The FDA has approved Vyvanse as a treatment option for both ADHD and binge eating. Vyvanse targets the  brain's reward center by increasing the levels of dopamine and norepinephrine, the chemicals of the brain responsible for feelings of pleasure. Mindful eating is the recommended nutritional approach to treating BED.   6. Depression screening Chayil was screened for depression as part of her new patient workup today.  PHQ-9 is 20.  Ashley Lucas had a positive depression screening. Depression is commonly associated with obesity and often results in emotional eating behaviors. We will monitor this closely and work on CBT to help improve the non-hunger eating patterns. Referral to Psychology may be required if no improvement is seen as she continues in our clinic.  7. At risk for anxiety Ashley Lucas was given approximately 15 minutes of anxiety risk counseling today. She has risk factors for anxiety and recently weaned off of her antianxiety medication. We discussed the importance of a healthy work life balance, a healthy relationship with food and a good support system.  8. Class 3 severe obesity with serious comorbidity and body mass index (BMI) of 40.0 to 44.9 in adult, unspecified obesity type (HCC)  Ashley Lucas is currently in the action stage of change and her goal is to continue with weight loss efforts. I recommend Ashley Lucas begin the structured treatment plan as follows:  She has agreed to practicing portion control and making smarter food choices, such as increasing vegetables and decreasing simple carbohydrates.  Exercise  goals: No exercise has been prescribed at this time.   Behavioral modification strategies: increasing lean protein intake, decreasing simple carbohydrates, increasing vegetables, increasing water intake, decreasing liquid calories, decreasing alcohol intake, decreasing sodium intake, increasing high fiber foods and emotional eating strategies.  She was informed of the importance of frequent follow-up visits to maximize her success with intensive lifestyle modifications for  her multiple health conditions. She was informed we would discuss her lab results at her next visit unless there is a critical issue that needs to be addressed sooner. Ashley CornfieldStephanie agreed to keep her next visit at the agreed upon time to discuss these results.  Objective:   Blood pressure 115/80, pulse 93, temperature (!) 97.5 F (36.4 C), height 5\' 6"  (1.676 m), weight 262 lb (118.8 kg), SpO2 99 %. Body mass index is 42.29 kg/m.  EKG: Normal sinus rhythm, rate 92 bpm.  Indirect Calorimeter completed today shows a VO2 of 325 and a REE of 2260.  Her calculated basal metabolic rate is [id number] thus her basal metabolic rate is better than expected.  General: Cooperative, alert, well developed, in no acute distress. HEENT: Conjunctivae and lids unremarkable. Cardiovascular: Regular rhythm.  Lungs: Normal work of breathing. Neurologic: No focal deficits.   Lab Results  Component Value Date   CREATININE 0.70 08/19/2020   BUN 12 08/19/2020   NA 141 08/19/2020   K 4.6 08/19/2020   CL 102 08/19/2020   CO2 28 08/19/2020   Lab Results  Component Value Date   ALT 14 08/19/2020   AST 16 08/19/2020   ALKPHOS 72 08/19/2020   BILITOT 0.5 08/19/2020   Lab Results  Component Value Date   HGBA1C 4.9 08/19/2020   HGBA1C 4.8 11/19/2019   Lab Results  Component Value Date   INSULIN 9.2 08/19/2020   Lab Results  Component Value Date   TSH 1.810 08/19/2020   Lab Results  Component Value Date   CHOL 220 (H) 08/19/2020   HDL 60 08/19/2020   LDLCALC 142 (H) 08/19/2020   TRIG 99 08/19/2020   Lab Results  Component Value Date   WBC 8.5 08/19/2020   HGB 14.8 08/19/2020   HCT 43.4 08/19/2020   MCV 93 08/19/2020   PLT 292 08/19/2020   Lab Results  Component Value Date   IRON 61 08/19/2020   TIBC 305 08/19/2020   FERRITIN 166 (H) 08/19/2020   Attestation Statements:   This is the patient's first visit at [facility]. The patient's NEW PATIENT PACKET was reviewed at  length. Included in the packet: current and past health history, medications, allergies, ROS, gynecologic history (women only), surgical history, family history, social history, weight history, weight loss surgery history (for those that have had weight loss surgery), nutritional evaluation, mood and food questionnaire, PHQ9, Epworth questionnaire, sleep habits questionnaire, patient life and health improvement goals questionnaire. These will all be scanned into the patient's chart under media.   During the visit, I independently reviewed the patient's EKG, bioimpedance scale results, and indirect calorimeter results. I used this information to tailor a meal plan for the patient that will help her to lose weight and will improve her obesity-related conditions going forward. I performed a medically necessary appropriate examination and/or evaluation. I discussed the assessment and treatment plan with the patient. The patient was provided an opportunity to ask questions and all were answered. The patient agreed with the plan and demonstrated an understanding of the instructions. Labs were ordered at this visit and will be  reviewed at the next visit unless more critical results need to be addressed immediately. Clinical information was updated and documented in the EMR.   I, Insurance claims handler, CMA, am acting as transcriptionist for Helane Rima, DO  I have reviewed the above documentation for accuracy and completeness, and I agree with the above. Helane Rima, DO

## 2020-08-21 ENCOUNTER — Encounter (INDEPENDENT_AMBULATORY_CARE_PROVIDER_SITE_OTHER): Payer: Self-pay | Admitting: Family Medicine

## 2020-08-22 ENCOUNTER — Encounter (INDEPENDENT_AMBULATORY_CARE_PROVIDER_SITE_OTHER): Payer: Self-pay | Admitting: Family Medicine

## 2020-08-24 NOTE — Telephone Encounter (Signed)
Dr Wallace pt °

## 2020-09-01 ENCOUNTER — Encounter (INDEPENDENT_AMBULATORY_CARE_PROVIDER_SITE_OTHER): Payer: Self-pay | Admitting: Family Medicine

## 2020-09-01 ENCOUNTER — Other Ambulatory Visit: Payer: Self-pay

## 2020-09-01 ENCOUNTER — Ambulatory Visit (INDEPENDENT_AMBULATORY_CARE_PROVIDER_SITE_OTHER): Payer: BC Managed Care – PPO | Admitting: Family Medicine

## 2020-09-01 VITALS — BP 110/78 | HR 93 | Temp 98.2°F | Ht 66.0 in | Wt 264.0 lb

## 2020-09-01 DIAGNOSIS — Z6841 Body Mass Index (BMI) 40.0 and over, adult: Secondary | ICD-10-CM

## 2020-09-01 DIAGNOSIS — F5081 Binge eating disorder: Secondary | ICD-10-CM

## 2020-09-01 DIAGNOSIS — F411 Generalized anxiety disorder: Secondary | ICD-10-CM

## 2020-09-01 DIAGNOSIS — Z9189 Other specified personal risk factors, not elsewhere classified: Secondary | ICD-10-CM

## 2020-09-01 DIAGNOSIS — Z87891 Personal history of nicotine dependence: Secondary | ICD-10-CM

## 2020-09-01 MED ORDER — LISDEXAMFETAMINE DIMESYLATE 50 MG PO CAPS: 50.0000 mg | ORAL_CAPSULE | Freq: Every day | ORAL | 0 refills | Status: DC

## 2020-09-01 MED ORDER — FLUOXETINE HCL 20 MG PO TABS: 20.0000 mg | ORAL_TABLET | Freq: Every day | ORAL | 0 refills | Status: DC

## 2020-09-03 NOTE — Progress Notes (Signed)
Chief Complaint:   OBESITY Ashley Lucas is here to discuss her progress with her obesity treatment plan along with follow-up of her obesity related diagnoses.   Today's visit was #: 2 Starting weight: 262 lbs Starting date: 08/19/2020 Today's weight: 264 lbs Today's date: 09/01/2020 Total lbs lost to date: +2 Body mass index is 42.61 kg/m.   Interim History: Ashley Lucas says she has had no ETOH.  She has been drinking more water.  She will add fruit or vegetable to breakfast and lunch.  Labs reviewed together today. She has a family history of HLD.  Nutrition Plan: practicing portion control and making smarter food choices, such as increasing vegetables and decreasing simple carbohydrates.  Anti-obesity medications: metformin XR 1000 mg daily.  Hunger is moderately controlled controlled. Cravings are moderately controlled controlled.  Activity: None.  Assessment/Plan:   1. Stopped smoking Ashley Lucas has stopped smoking! Plan: commended patient for quitting and reviewed strategies for preventing relapses.  2. Binge eating disorder Ashley Lucas meets binge eating disorder (BED) requirements, including: binging 4-13 times a week , eating until feeling uncomfortably full, eating large amounts of food when not feeling physically full and depressed or guilty about binge eating.   People who binge eat feel as if they don't have control over how much they eat and have feelings of guilt or self-loathing after a binge eating episode. [facility] [employer] estimates that about 30 percent of adults with binge eating disorder also have a history of ADHD. The FDA has approved Vyvanse as a treatment option for both ADHD and binge eating. Vyvanse targets the brain's reward center by increasing the levels of dopamine and norepinephrine, the chemicals of the brain responsible for feelings of pleasure. Mindful eating is the recommended nutritional approach to treating BED.   - Start lisdexamfetamine (VYVANSE)  50 MG capsule; Take 1 capsule (50 mg total) by mouth daily.  Dispense: 30 capsule; Refill: 0  I have consulted the [place] Controlled Substances Registry for this patient, and feel the risk/benefit ratio today is favorable for proceeding with this prescription for a controlled substance. The patient understands monitoring parameters and red flags.   3. GAD (generalized anxiety disorder) Ashley Lucas has stopped drinking ETOH. Anxiety is high. After discussion, patient would like to start below medication. Expectations, risks, and potential side effects reviewed.   - Start FLUoxetine (PROZAC) 20 MG tablet; Take 1 tablet (20 mg total) by mouth daily.  Dispense: 30 tablet; Refill: 0  4. At risk for heart disease Ashley Lucas was given approximately 15 minutes of coronary artery disease prevention counseling today. She is 34 y.o. female and has risk factors for heart disease including obesity and Hx of smoking. We discussed intensive lifestyle modifications today with an emphasis on specific weight loss instructions and strategies.   5. Class 3 severe obesity with serious comorbidity and body mass index (BMI) of 40.0 to 44.9 in adult, unspecified obesity type (HCC)  Course: Ashley Lucas is currently in the action stage of change. As such, her goal is to continue with weight loss efforts.   Nutrition goals: She has agreed to practicing portion control and making smarter food choices, such as increasing vegetables and decreasing simple carbohydrates.   Exercise goals: No exercise has been prescribed at this time.  Behavioral modification strategies: increasing lean protein intake, decreasing simple carbohydrates, increasing vegetables, increasing water intake and decreasing liquid calories.  Ashley Lucas has agreed to follow-up with our clinic in 2 weeks. She was informed of the importance of frequent follow-up visits  to maximize her success with intensive lifestyle modifications for her multiple health conditions.     Objective:   Blood pressure 110/78, pulse 93, temperature 98.2 F (36.8 C), temperature source Oral, height 5\' 6"  (1.676 m), weight 264 lb (119.7 kg), SpO2 100 %. Body mass index is 42.61 kg/m.  General: Cooperative, alert, well developed, in no acute distress. HEENT: Conjunctivae and lids unremarkable. Cardiovascular: Regular rhythm.  Lungs: Normal work of breathing. Neurologic: No focal deficits.   Lab Results  Component Value Date   CREATININE 0.70 08/19/2020   BUN 12 08/19/2020   NA 141 08/19/2020   K 4.6 08/19/2020   CL 102 08/19/2020   CO2 28 08/19/2020   Lab Results  Component Value Date   ALT 14 08/19/2020   AST 16 08/19/2020   ALKPHOS 72 08/19/2020   BILITOT 0.5 08/19/2020   Lab Results  Component Value Date   HGBA1C 4.9 08/19/2020   HGBA1C 4.8 11/19/2019   Lab Results  Component Value Date   INSULIN 9.2 08/19/2020   Lab Results  Component Value Date   TSH 1.810 08/19/2020   Lab Results  Component Value Date   CHOL 220 (H) 08/19/2020   HDL 60 08/19/2020   LDLCALC 142 (H) 08/19/2020   TRIG 99 08/19/2020   Lab Results  Component Value Date   WBC 8.5 08/19/2020   HGB 14.8 08/19/2020   HCT 43.4 08/19/2020   MCV 93 08/19/2020   PLT 292 08/19/2020   Lab Results  Component Value Date   IRON 61 08/19/2020   TIBC 305 08/19/2020   FERRITIN 166 (H) 08/19/2020   Attestation Statements:   Reviewed by clinician on day of visit: allergies, medications, problem list, medical history, surgical history, family history, social history, and previous encounter notes.  I, 08/21/2020, CMA, am acting as transcriptionist for Insurance claims handler, DO  I have reviewed the above documentation for accuracy and completeness, and I agree with the above. Helane Rima, DO

## 2020-09-06 ENCOUNTER — Encounter (INDEPENDENT_AMBULATORY_CARE_PROVIDER_SITE_OTHER): Payer: Self-pay | Admitting: Family Medicine

## 2020-09-14 MED ORDER — VYVANSE 50 MG PO CHEW: 1.0000 | CHEWABLE_TABLET | Freq: Every day | ORAL | 0 refills | Status: DC

## 2020-09-24 ENCOUNTER — Ambulatory Visit (INDEPENDENT_AMBULATORY_CARE_PROVIDER_SITE_OTHER): Payer: BC Managed Care – PPO | Admitting: Family Medicine

## 2020-09-24 ENCOUNTER — Other Ambulatory Visit: Payer: Self-pay

## 2020-09-24 ENCOUNTER — Encounter (INDEPENDENT_AMBULATORY_CARE_PROVIDER_SITE_OTHER): Payer: Self-pay | Admitting: Family Medicine

## 2020-09-24 VITALS — BP 131/82 | HR 111 | Temp 98.0°F | Ht 66.0 in | Wt 258.0 lb

## 2020-09-24 DIAGNOSIS — F5081 Binge eating disorder: Secondary | ICD-10-CM | POA: Diagnosis not present

## 2020-09-24 DIAGNOSIS — E282 Polycystic ovarian syndrome: Secondary | ICD-10-CM | POA: Diagnosis not present

## 2020-09-24 DIAGNOSIS — F411 Generalized anxiety disorder: Secondary | ICD-10-CM | POA: Diagnosis not present

## 2020-09-24 DIAGNOSIS — Z9189 Other specified personal risk factors, not elsewhere classified: Secondary | ICD-10-CM | POA: Diagnosis not present

## 2020-09-24 DIAGNOSIS — Z6841 Body Mass Index (BMI) 40.0 and over, adult: Secondary | ICD-10-CM

## 2020-09-24 MED ORDER — METFORMIN HCL ER 500 MG PO TB24: 1000.0000 mg | ORAL_TABLET | Freq: Every day | ORAL | 6 refills | Status: AC

## 2020-09-24 MED ORDER — FLUOXETINE HCL 20 MG PO TABS: 20.0000 mg | ORAL_TABLET | Freq: Every day | ORAL | 0 refills | Status: AC

## 2020-09-24 MED ORDER — VYVANSE 50 MG PO CHEW: 1.0000 | CHEWABLE_TABLET | Freq: Every day | ORAL | 0 refills | Status: AC

## 2020-09-29 NOTE — Progress Notes (Signed)
Chief Complaint:   OBESITY Ashley Lucas is here to discuss her progress with her obesity treatment plan along with follow-up of her obesity related diagnoses.   Today's visit was #: 3 Starting weight: 262 lbs Starting date: 08/19/2020 Today's weight: 258 lbs Today's date: 09/28/2020 Total lbs lost to date: 4 lbs Body mass index is 41.64 kg/m.  Total weight loss percentage to date: -1.53%  Nutrition Plan: practicing portion control and making smarter food choices, such as increasing vegetables and decreasing simple carbohydrates most of the time.  Anti-obesity medications: metformin 1000 mg twice daily.  Activity: None at this time.  Assessment/Plan:   1. PCOS (polycystic ovarian syndrome) She will continue to focus on protein-rich, low simple carbohydrate foods. We reviewed the importance of hydration, regular exercise for stress reduction, and restorative sleep. Medication:   Counseling . PCOS is a leading cause of menstrual irregularities and infertility. It is also associated with obesity, hirsutism (excessive hair growth on the face, chest, or back), and cardiovascular risk factors such as high cholesterol and insulin resistance. . Insulin resistance appears to play a central role.  . Women with PCOS have been shown to have impaired appetite-regulating hormones. . Metformin is one medication that can improve metabolic parameters.  . Women with polycystic ovary syndrome (PCOS) have an increased risk for cardiovascular disease (CVD) - European Journal of Preventive Cardiology. .  - Refill metFORMIN (GLUCOPHAGE XR) 500 MG 24 hr tablet; Take 2 tablets (1,000 mg total) by mouth daily with breakfast.  Dispense: 60 tablet; Refill: 6  2. Binge eating disorder People who binge eat feel as if they don't have control over how much they eat and have feelings of guilt or self-loathing after a binge eating episode. [employer] estimates that about 30 percent of adults with binge  eating disorder also have a history of ADHD. The FDA has approved Vyvanse as a treatment option for both ADHD and binge eating. Vyvanse targets the brain's reward center by increasing the levels of dopamine and norepinephrine, the chemicals of the brain responsible for feelings of pleasure. Mindful eating is the recommended nutritional approach to treating BED.   - Refill Lisdexamfetamine Dimesylate (VYVANSE) 50 MG CHEW; Chew 1 tablet by mouth daily.  Dispense: 30 tablet; Refill: 0  3. GAD (generalized anxiety disorder) Behavior modification techniques were discussed today to help [name] deal with her anxiety.    - Refill FLUoxetine (PROZAC) 20 MG tablet; Take 1 tablet (20 mg total) by mouth daily.  Dispense: 30 tablet; Refill: 0  4. At risk for heart disease [name] was given approximately 9 minutes of coronary artery disease prevention counseling today. She is 34 y.o. female and has risk factors for heart disease including obesity and PCOS. We discussed intensive lifestyle modifications today with an emphasis on specific weight loss instructions and strategies. Repetitive spaced learning was employed today to elicit superior memory formation and behavioral change.  During insulin resistance, several metabolic alterations induce the development of cardiovascular disease. For instance, insulin resistance can induce an imbalance in glucose metabolism that generates chronic hyperglycemia, which in turn triggers oxidative stress and causes an inflammatory response that leads to cell damage. Insulin resistance can also alter systemic lipid metabolism which then leads to the development of dyslipidemia and the well-known lipid triad: (1) high levels of plasma triglycerides, (2) low levels of high-density lipoprotein, and (3) the appearance of small dense low-density lipoproteins. This triad, along with endothelial dysfunction, which can also be induced by aberrant insulin  signaling, contribute to  atherosclerotic plaque formation.   5. Class 3 severe obesity with serious comorbidity and body mass index (BMI) of 40.0 to 44.9 in adult, unspecified obesity type (HCC)  Course: [name] is currently in the action stage of change. As such, her goal is to continue with weight loss efforts.   Nutrition goals: She has agreed to practicing portion control and making smarter food choices, such as increasing vegetables and decreasing simple carbohydrates.   Exercise goals: 15 minutes 3 times per week.  Behavioral modification strategies: increasing lean protein intake, decreasing simple carbohydrates, increasing vegetables, increasing water intake and decreasing liquid calories.  [name] has agreed to follow-up with our clinic in 4 weeks. She was informed of the importance of frequent follow-up visits to maximize her success with intensive lifestyle modifications for her multiple health conditions.   Objective:   Blood pressure 131/82, pulse (!) 111, temperature 98 F (36.7 C), temperature source Oral, height 5\' 6"  (1.676 m), weight 258 lb (117 kg), SpO2 99 %. Body mass index is 41.64 kg/m.  General: Cooperative, alert, well developed, in no acute distress. HEENT: Conjunctivae and lids unremarkable. Cardiovascular: Regular rhythm.  Lungs: Normal work of breathing. Neurologic: No focal deficits.   Lab Results  Component Value Date   CREATININE 0.70 08/19/2020   BUN 12 08/19/2020   NA 141 08/19/2020   K 4.6 08/19/2020   CL 102 08/19/2020   CO2 28 08/19/2020   Lab Results  Component Value Date   ALT 14 08/19/2020   AST 16 08/19/2020   ALKPHOS 72 08/19/2020   BILITOT 0.5 08/19/2020   Lab Results  Component Value Date   HGBA1C 4.9 08/19/2020   HGBA1C 4.8 11/19/2019   Lab Results  Component Value Date   INSULIN 9.2 08/19/2020   Lab Results  Component Value Date   TSH 1.810 08/19/2020   Lab Results  Component Value Date   CHOL 220 (H) 08/19/2020   HDL 60 08/19/2020    LDLCALC 142 (H) 08/19/2020   TRIG 99 08/19/2020   Lab Results  Component Value Date   WBC 8.5 08/19/2020   HGB 14.8 08/19/2020   HCT 43.4 08/19/2020   MCV 93 08/19/2020   PLT 292 08/19/2020   Lab Results  Component Value Date   IRON 61 08/19/2020   TIBC 305 08/19/2020   FERRITIN 166 (H) 08/19/2020   Attestation Statements:   Reviewed by clinician on day of visit: allergies, medications, problem list, medical history, surgical history, family history, social history, and previous encounter notes.  I, 08/21/2020, CMA, am acting as transcriptionist for Insurance claims handler, DO  I have reviewed the above documentation for accuracy and completeness, and I agree with the above. Helane Rima, DO

## 2020-10-19 IMAGING — DX DG ANKLE COMPLETE 3+V*L*
3 series · 3 of 3 positions shown · non-contrast
Comparison: None.

CLINICAL DATA: Left ankle injury.

EXAM:
LEFT ANKLE COMPLETE - 3+ VIEW

[ankle ap]
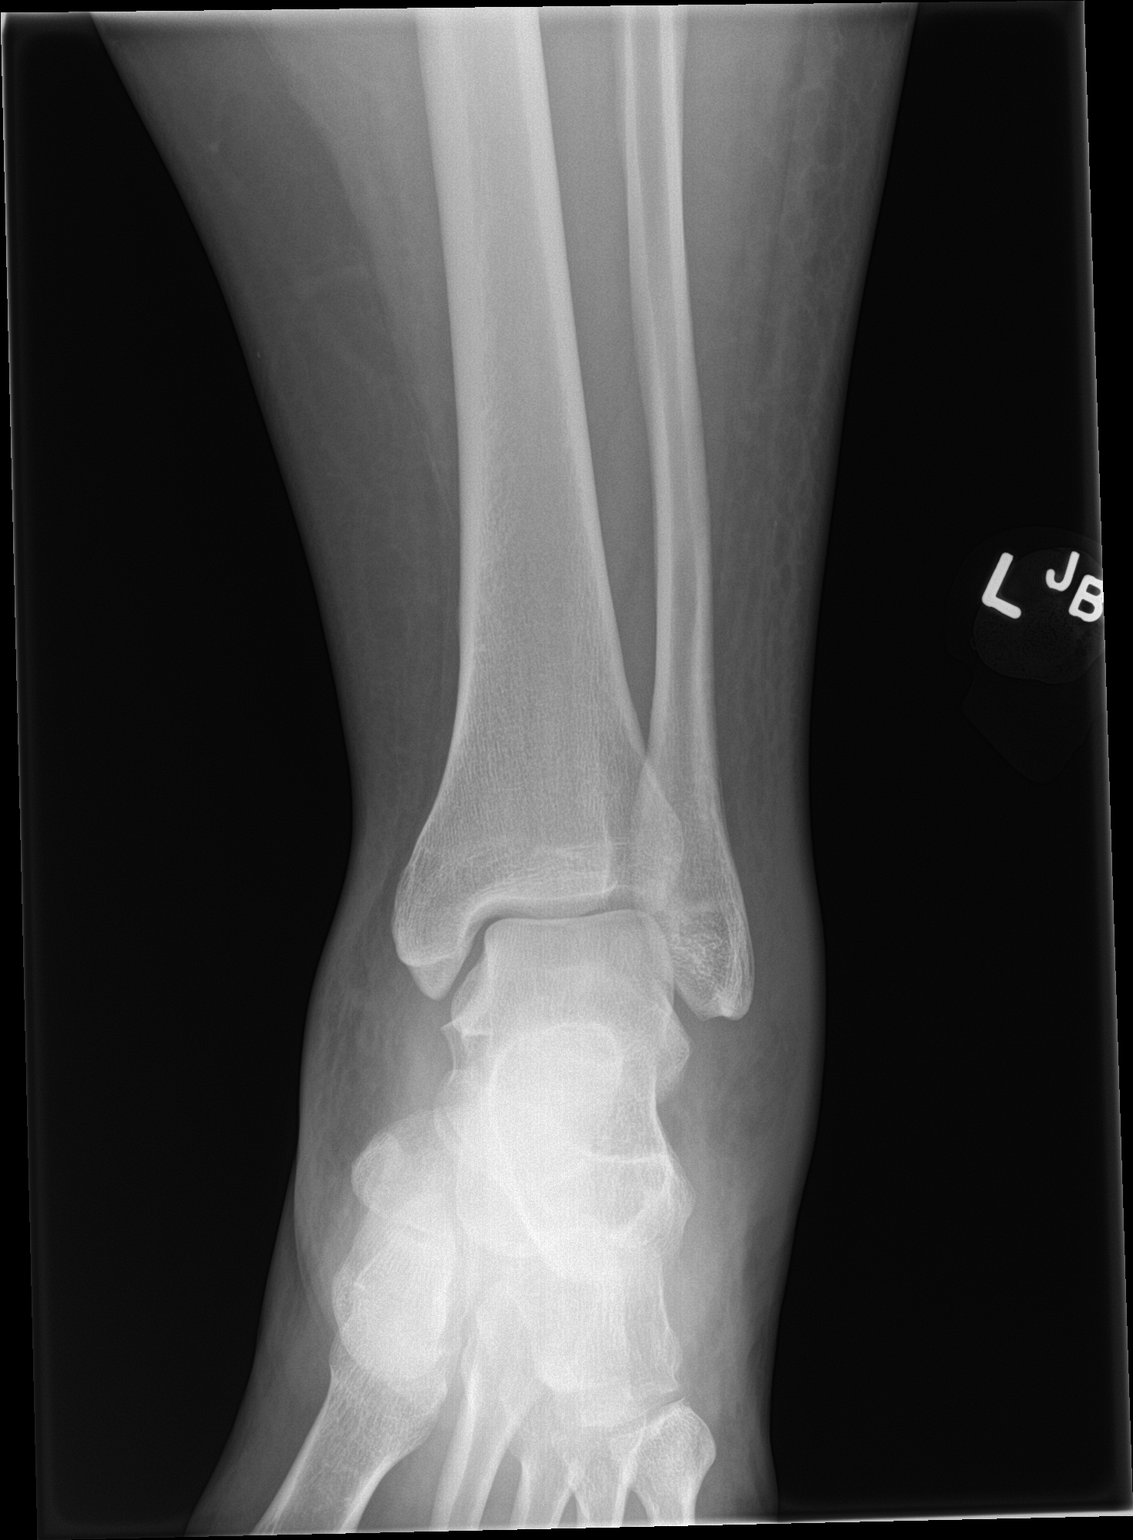

[ankle obl]
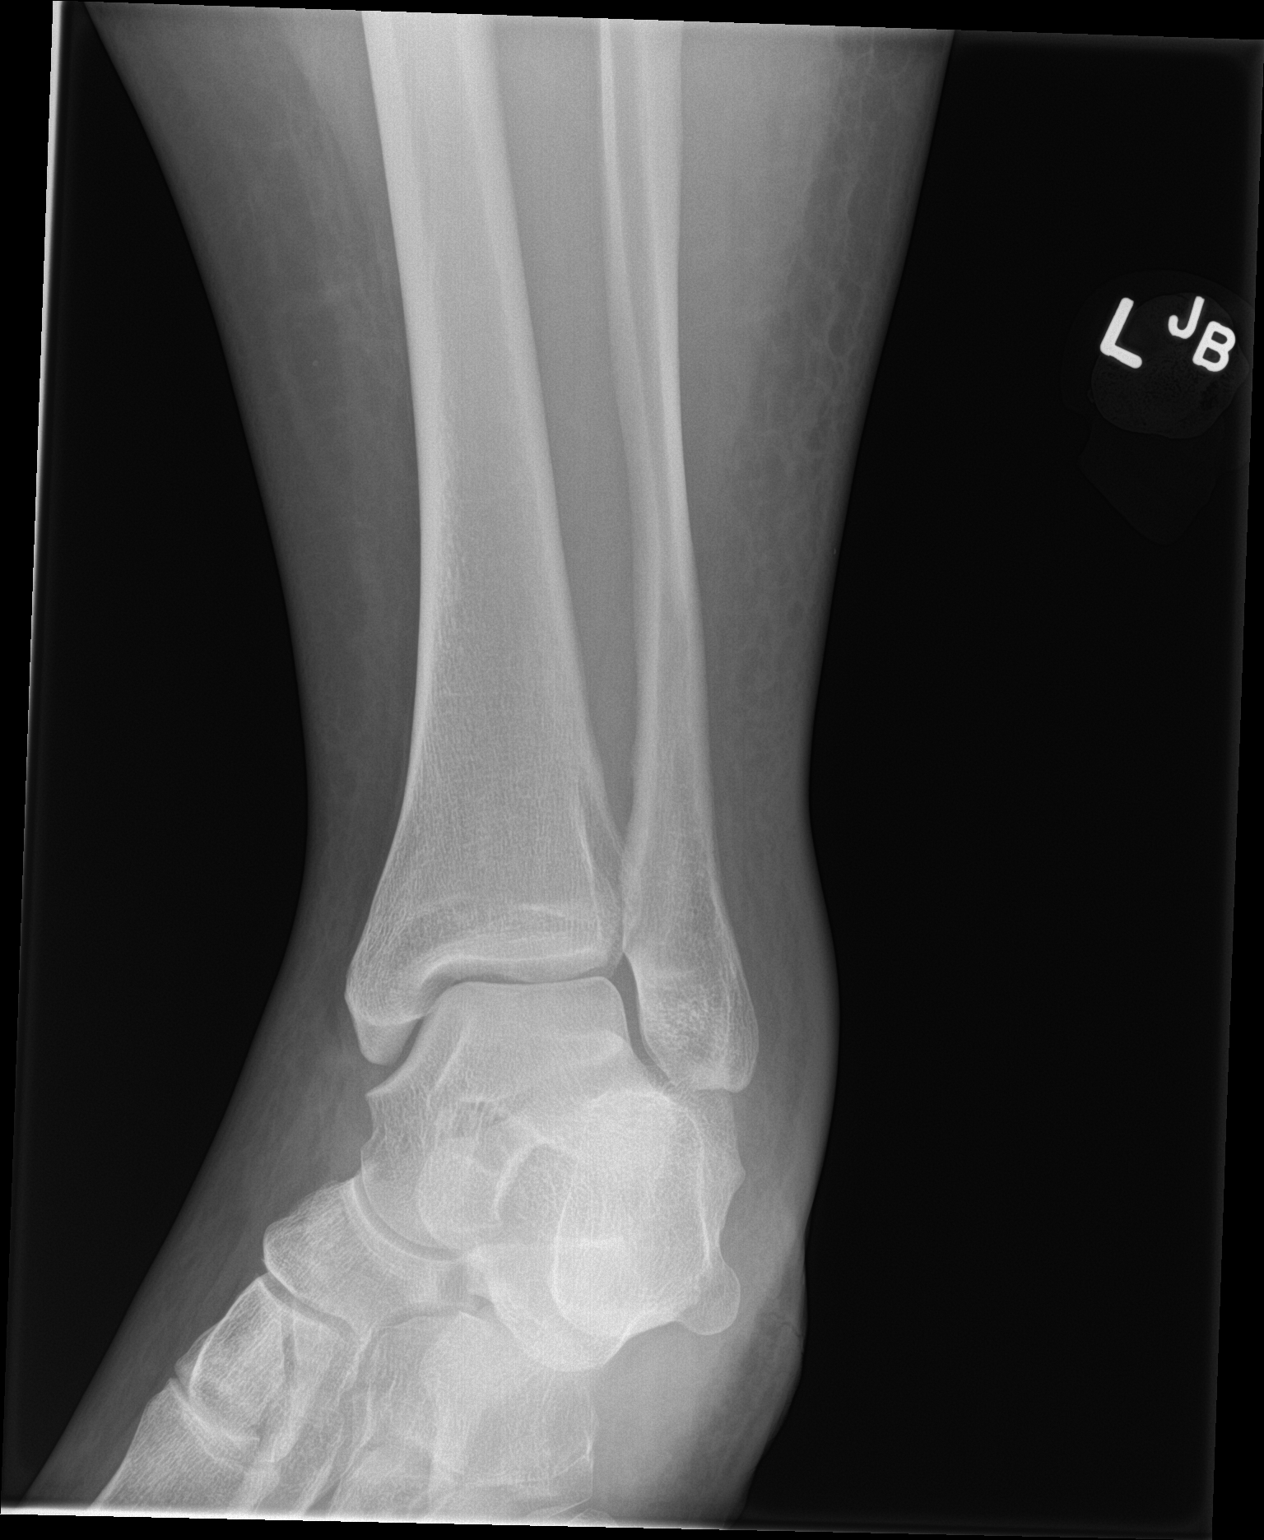

[ankle lat]
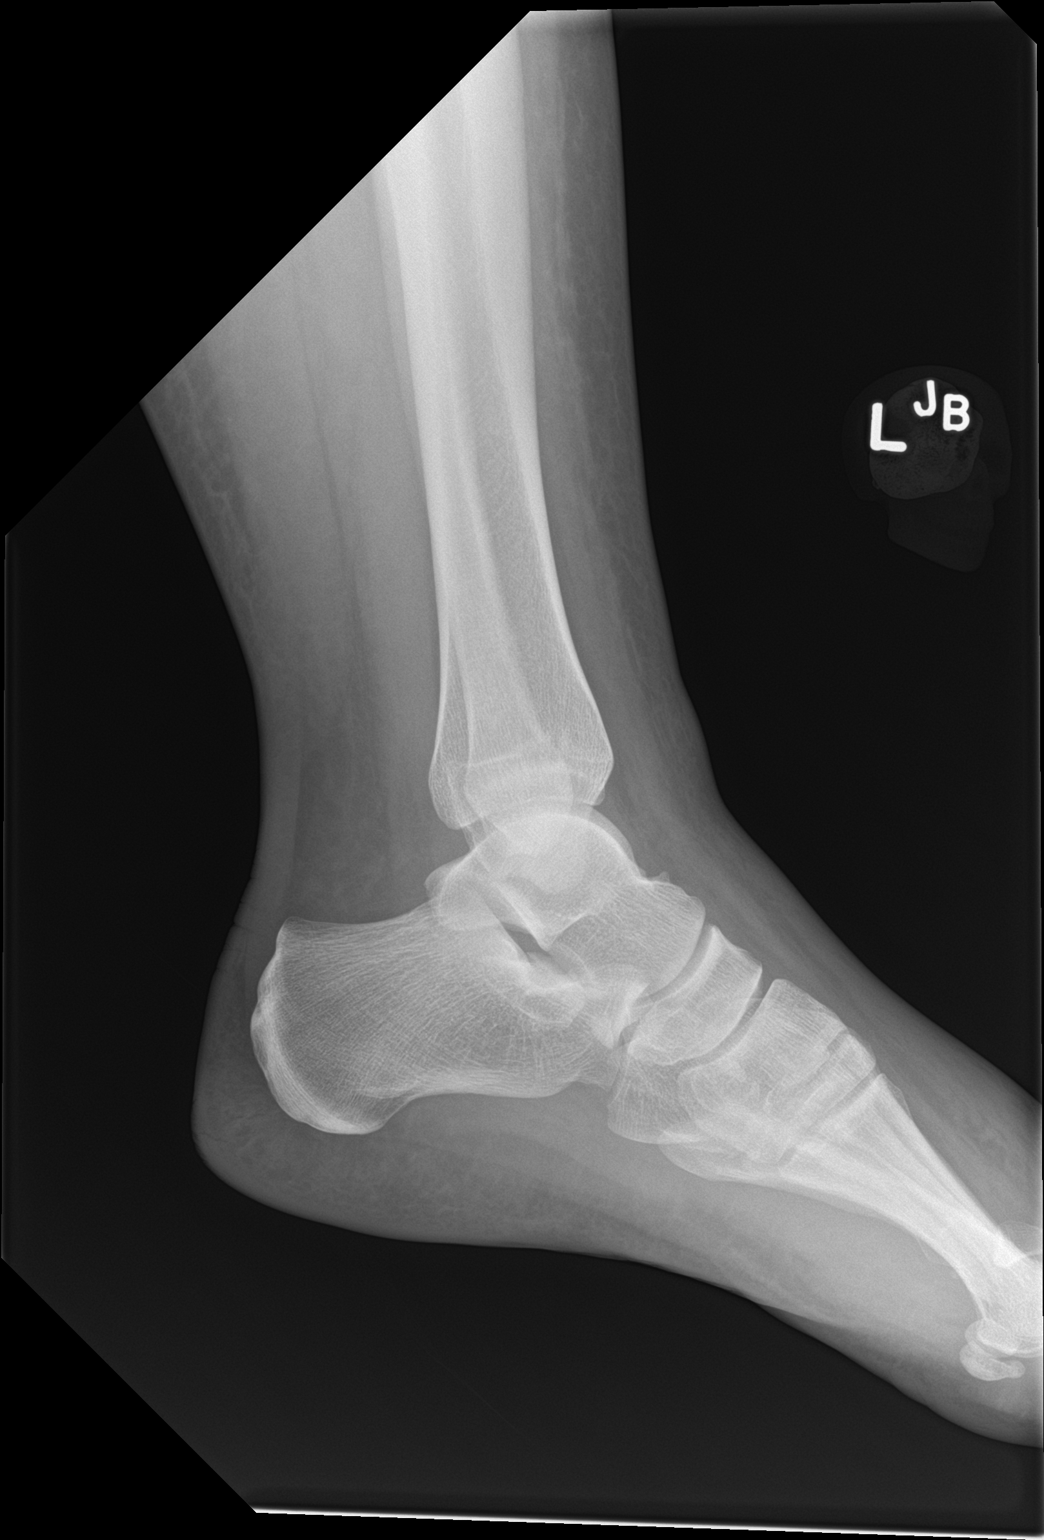

[3 of 3 positions shown; findings below may reference images not displayed]

FINDINGS: There is diffuse soft tissue swelling. No underlying fracture or
subluxation identified. No radiopaque foreign bodies.
IMPRESSION: 1. Soft tissue swelling.
2. No acute osseous abnormality noted.

## 2020-10-24 ENCOUNTER — Other Ambulatory Visit (INDEPENDENT_AMBULATORY_CARE_PROVIDER_SITE_OTHER): Payer: Self-pay | Admitting: Family Medicine

## 2020-10-24 DIAGNOSIS — F411 Generalized anxiety disorder: Secondary | ICD-10-CM

## 2020-10-26 ENCOUNTER — Encounter (INDEPENDENT_AMBULATORY_CARE_PROVIDER_SITE_OTHER): Payer: Self-pay

## 2020-10-26 NOTE — Telephone Encounter (Signed)
Last OV with Dr Wallace 

## 2020-10-26 NOTE — Telephone Encounter (Signed)
Message sent to pt.

## 2020-10-27 ENCOUNTER — Ambulatory Visit (INDEPENDENT_AMBULATORY_CARE_PROVIDER_SITE_OTHER): Payer: BC Managed Care – PPO | Admitting: Family Medicine

## 2020-11-05 ENCOUNTER — Ambulatory Visit: Payer: Self-pay | Admitting: Family Medicine

## 2020-11-16 ENCOUNTER — Ambulatory Visit (INDEPENDENT_AMBULATORY_CARE_PROVIDER_SITE_OTHER): Payer: BC Managed Care – PPO | Admitting: Family Medicine

## 2020-11-19 ENCOUNTER — Other Ambulatory Visit: Payer: Self-pay

## 2020-11-19 ENCOUNTER — Encounter: Payer: Self-pay | Admitting: Family Medicine

## 2020-11-19 ENCOUNTER — Ambulatory Visit: Payer: BC Managed Care – PPO | Admitting: Family Medicine

## 2020-11-19 VITALS — BP 134/82 | HR 90 | Ht 66.0 in | Wt 267.0 lb

## 2020-11-19 DIAGNOSIS — E282 Polycystic ovarian syndrome: Secondary | ICD-10-CM

## 2020-11-19 DIAGNOSIS — N911 Secondary amenorrhea: Secondary | ICD-10-CM | POA: Diagnosis not present

## 2020-11-19 DIAGNOSIS — N979 Female infertility, unspecified: Secondary | ICD-10-CM | POA: Diagnosis not present

## 2020-11-19 NOTE — Progress Notes (Signed)
Patient was given Femara and has not gotten pregnant. Armandina Stammer RN

## 2020-11-19 NOTE — Progress Notes (Signed)
   Subjective:    Patient ID: Ashley Lucas, female    DOB: May 25, 1986, 35 y.o.   MRN: [id number]  HPI Seen for follow up of PCOS and infertility. She has been having regular cycles with the metformin $RemoveBefor'1000mg'OaXEsGyUOpmX$  daily. Took femara for three cycles. Using ovulation kit, which is signaling ovulation.   Review of Systems     Objective:   Physical Exam Vitals reviewed.  Constitutional:      Appearance: Normal appearance.  Skin:    General: Skin is warm and dry.     Capillary Refill: Capillary refill takes less than 2 seconds.  Neurological:     General: No focal deficit present.     Mental Status: She is alert.  Psychiatric:        Mood and Affect: Mood normal.        Behavior: Behavior normal.        Thought Content: Thought content normal.        Judgment: Judgment normal.       Assessment & Plan:  1. Secondary amenorrhea Resolved  2. Infertility, female Will refer to Dr Kerin Perna.  Experiencing a lot of stress due to this - recommended relaxing until they get in to see REI and having more spontaneous sex. At this point, ovulating without femara, so will not continue.  - Ambulatory referral to Endocrinology  3. PCOS (polycystic ovarian syndrome) Continue metformin. - Ambulatory referral to Endocrinology

## 2020-12-16 DIAGNOSIS — R632 Polyphagia: Secondary | ICD-10-CM | POA: Diagnosis not present

## 2020-12-16 DIAGNOSIS — R41840 Attention and concentration deficit: Secondary | ICD-10-CM | POA: Diagnosis not present

## 2020-12-16 DIAGNOSIS — F331 Major depressive disorder, recurrent, moderate: Secondary | ICD-10-CM | POA: Diagnosis not present

## 2020-12-16 DIAGNOSIS — F411 Generalized anxiety disorder: Secondary | ICD-10-CM | POA: Diagnosis not present

## 2021-01-12 ENCOUNTER — Telehealth: Payer: Self-pay

## 2021-01-12 NOTE — Telephone Encounter (Signed)
Called pt to discuss medication. Pt made aware that her husband should continue taking the vitamins for 3 months then repeat the semen analysis. chiquita l wilson, CMA

## 2021-01-13 DIAGNOSIS — R632 Polyphagia: Secondary | ICD-10-CM | POA: Diagnosis not present

## 2021-01-13 DIAGNOSIS — F3342 Major depressive disorder, recurrent, in full remission: Secondary | ICD-10-CM | POA: Diagnosis not present

## 2021-01-13 DIAGNOSIS — F411 Generalized anxiety disorder: Secondary | ICD-10-CM | POA: Diagnosis not present

## 2021-01-13 DIAGNOSIS — R41840 Attention and concentration deficit: Secondary | ICD-10-CM | POA: Diagnosis not present

## 2021-07-16 ENCOUNTER — Other Ambulatory Visit: Payer: Self-pay | Admitting: Obstetrics & Gynecology

## 2021-07-16 DIAGNOSIS — N979 Female infertility, unspecified: Secondary | ICD-10-CM

## 2021-07-22 ENCOUNTER — Ambulatory Visit
Admission: RE | Admit: 2021-07-22 | Discharge: 2021-07-22 | Disposition: A | Discharge status: Home / Self Care | Payer: BC Managed Care – PPO | Source: Ambulatory Visit | Attending: Obstetrics & Gynecology | Admitting: Obstetrics & Gynecology

## 2021-07-22 DIAGNOSIS — N979 Female infertility, unspecified: Secondary | ICD-10-CM

## 2022-06-01 ENCOUNTER — Encounter (INDEPENDENT_AMBULATORY_CARE_PROVIDER_SITE_OTHER): Payer: Self-pay

## 2022-10-21 IMAGING — RF DG HYSTEROGRAM
1 series · 8 of 8 positions shown · IV contrast (omnipaque)
Comparison: None.

CLINICAL DATA: Primary infertility.  Polycystic ovary syndrome.

EXAM:
HYSTEROSALPINGOGRAM
TECHNIQUE: Following cleansing of the cervix and vagina with Betadine solution,
a hysterosalpingogram was performed using a 5-French
hysterosalpingogram catheter and Omnipaque 300 contrast. The patient
tolerated the examination without difficulty.

[Series 1: one shot · 8 of 8 slices shown]
[im 1/8]
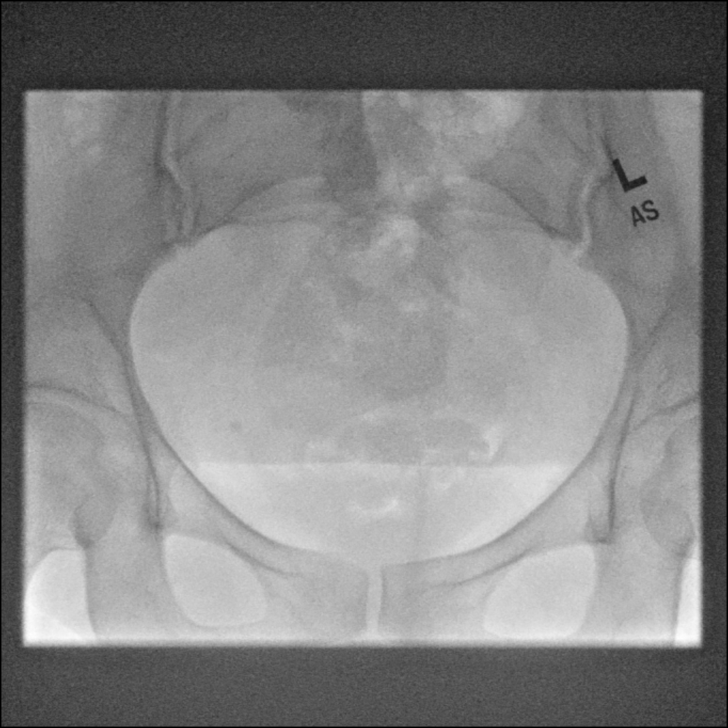
[im 2/8]
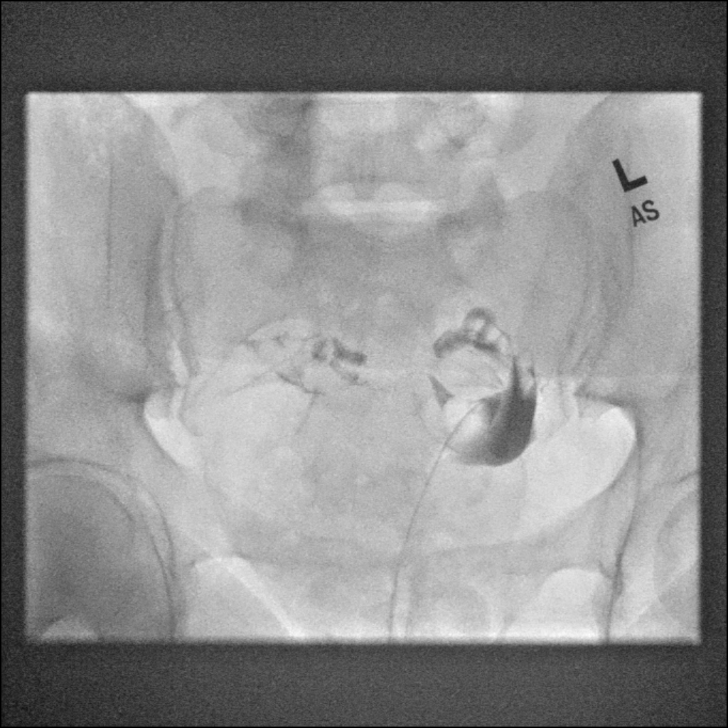
[im 3/8]
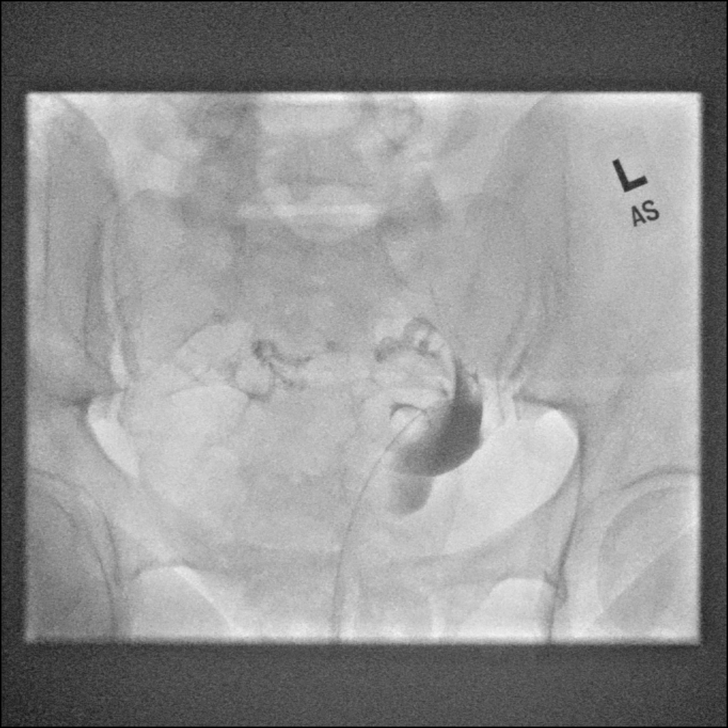
[im 4/8]
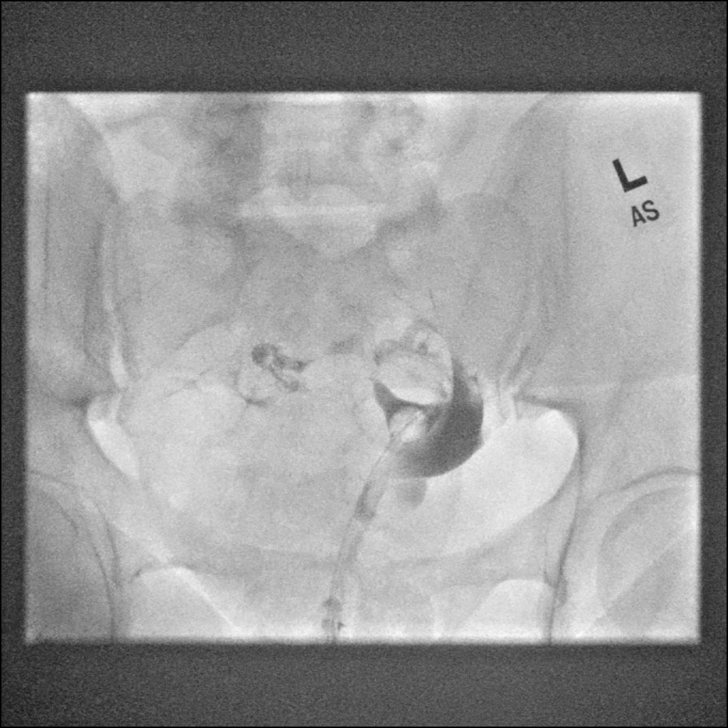
[im 5/8]
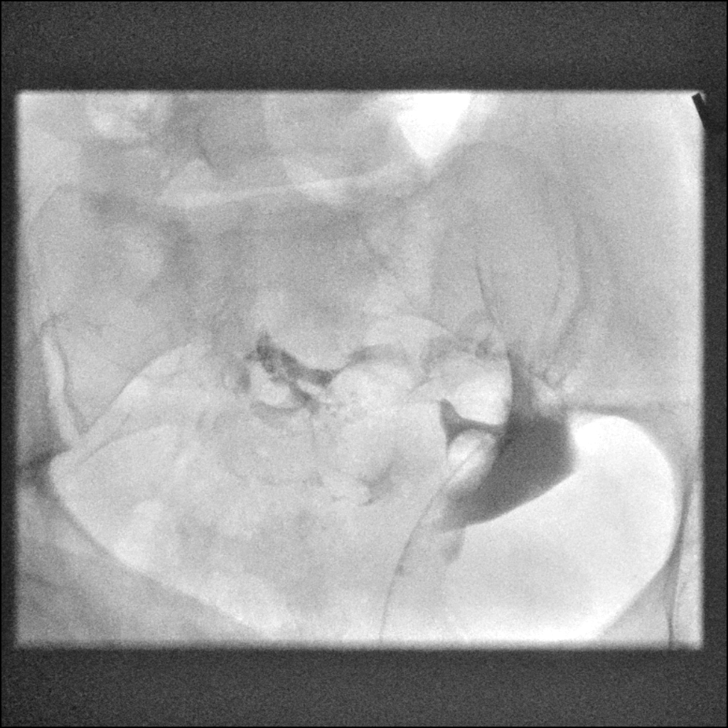
[im 6/8]
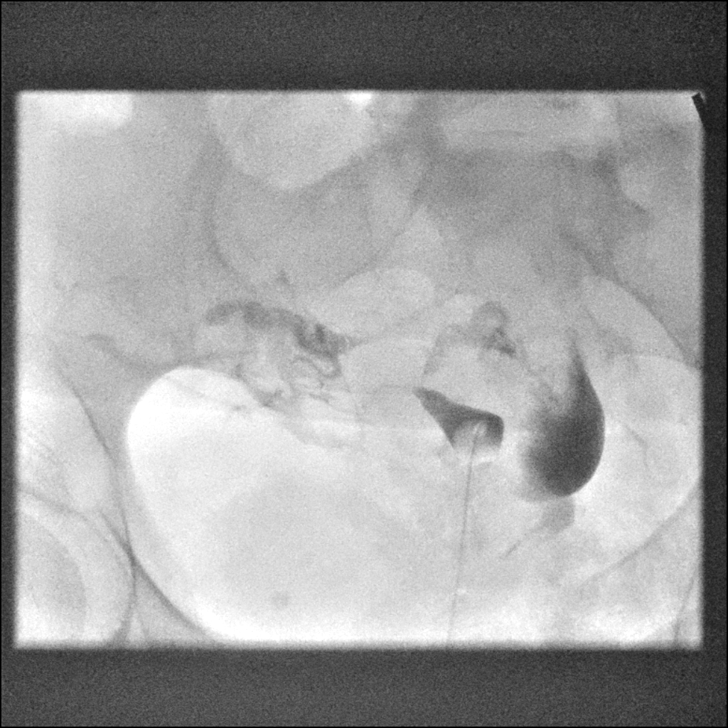
[im 7/8]
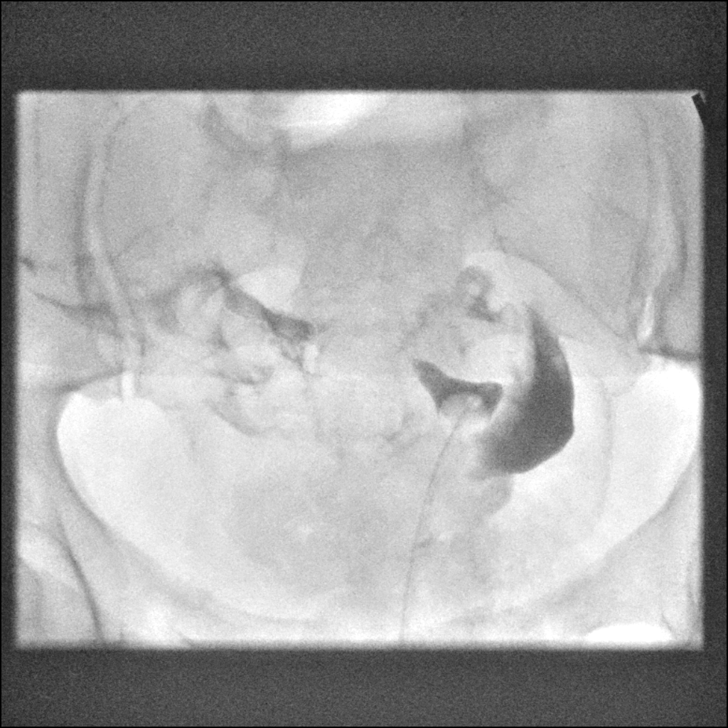
[im 8/8]
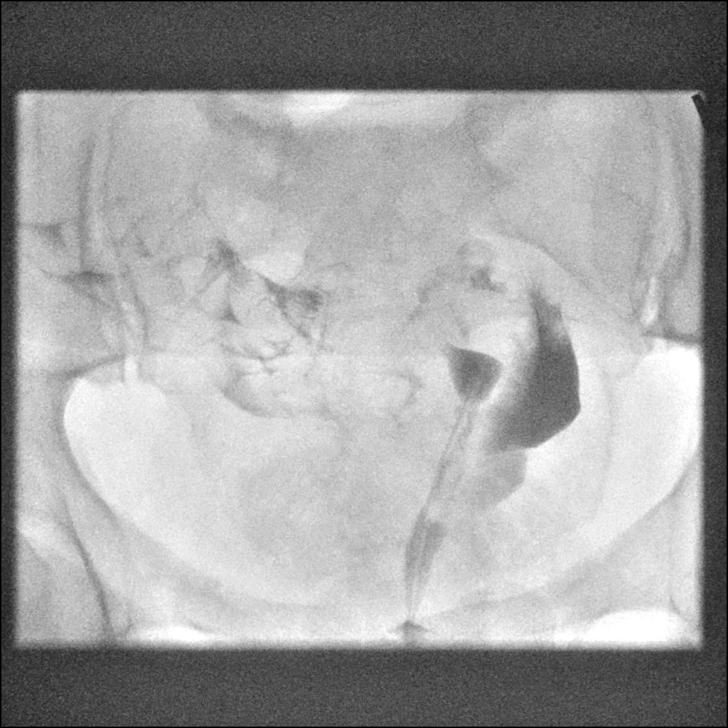

[8 of 8 positions shown; findings below may reference images not displayed]

FLUOROSCOPY TIME:  Radiation Exposure Index (as provided by the
fluoroscopic device): 26.0 mGy

If the device does not provide the exposure index:

Fluoroscopy Time:  0 minutes 48 seconds

Number of Acquired Images:  0
FINDINGS: Endometrial Cavity: Normal appearance. No signs of Mullerian duct
anomaly or other significant abnormality.

Right Fallopian Tube: Normal appearance. Free intraperitoneal spill
of contrast is demonstrated.

Left Fallopian Tube: Normal appearance. Free intraperitoneal spill
of contrast is demonstrated.

Other:  None.
IMPRESSION: Normal study. Both Fallopian tubes are patent.
